# Patient Record
Sex: Female | Born: 1970 | Race: White | Hispanic: No | Smoking: Former smoker
Health system: Southern US, Community
[De-identification: ages and names within clinical notes are randomized; demographics above are authoritative.]

## PROBLEM LIST (undated history)

## (undated) DIAGNOSIS — E785 Hyperlipidemia, unspecified: Secondary | ICD-10-CM

## (undated) DIAGNOSIS — F32A Depression, unspecified: Secondary | ICD-10-CM

## (undated) DIAGNOSIS — K219 Gastro-esophageal reflux disease without esophagitis: Secondary | ICD-10-CM

## (undated) DIAGNOSIS — F909 Attention-deficit hyperactivity disorder, unspecified type: Secondary | ICD-10-CM

## (undated) DIAGNOSIS — F1910 Other psychoactive substance abuse, uncomplicated: Secondary | ICD-10-CM

## (undated) DIAGNOSIS — F419 Anxiety disorder, unspecified: Secondary | ICD-10-CM

## (undated) HISTORY — DX: Other psychoactive substance abuse, uncomplicated: F19.10

## (undated) HISTORY — DX: Depression, unspecified: F32.A

## (undated) HISTORY — DX: Anxiety disorder, unspecified: F41.9

## (undated) HISTORY — DX: Attention-deficit hyperactivity disorder, unspecified type: F90.9

## (undated) HISTORY — DX: Gastro-esophageal reflux disease without esophagitis: K21.9

## (undated) HISTORY — DX: Hyperlipidemia, unspecified: E78.5

---

## 1996-07-31 HISTORY — PX: NOSE SURGERY: SHX723

## 2011-08-01 HISTORY — PX: TOTAL ABDOMINAL HYSTERECTOMY: SHX209

## 2012-11-06 DIAGNOSIS — F988 Other specified behavioral and emotional disorders with onset usually occurring in childhood and adolescence: Secondary | ICD-10-CM | POA: Insufficient documentation

## 2012-12-04 DIAGNOSIS — Z87891 Personal history of nicotine dependence: Secondary | ICD-10-CM | POA: Insufficient documentation

## 2017-01-10 ENCOUNTER — Encounter: Admit: 2017-01-10 | Discharge: 2017-01-10 | Payer: BC Managed Care – PPO

## 2017-11-07 ENCOUNTER — Encounter: Admit: 2017-11-07 | Discharge: 2017-11-07 | Payer: BC Managed Care – PPO

## 2018-01-29 ENCOUNTER — Ambulatory Visit: Admit: 2018-01-29 | Discharge: 2018-01-29 | Payer: BC Managed Care – PPO

## 2018-01-29 ENCOUNTER — Encounter: Admit: 2018-01-29 | Discharge: 2018-01-29 | Payer: BC Managed Care – PPO

## 2018-01-29 DIAGNOSIS — M67471 Ganglion, right ankle and foot: ICD-10-CM

## 2018-01-29 DIAGNOSIS — G4700 Insomnia, unspecified: ICD-10-CM

## 2018-01-29 DIAGNOSIS — M67441 Ganglion, right hand: ICD-10-CM

## 2018-01-29 DIAGNOSIS — R202 Paresthesia of skin: ICD-10-CM

## 2018-01-29 DIAGNOSIS — Z1239 Encounter for other screening for malignant neoplasm of breast: ICD-10-CM

## 2018-01-29 DIAGNOSIS — K219 Gastro-esophageal reflux disease without esophagitis: ICD-10-CM

## 2018-01-29 DIAGNOSIS — R200 Anesthesia of skin: ICD-10-CM

## 2018-01-29 DIAGNOSIS — L409 Psoriasis, unspecified: ICD-10-CM

## 2018-01-29 DIAGNOSIS — Z0000 Encounter for general adult medical examination without abnormal findings: Secondary | ICD-10-CM

## 2018-01-29 DIAGNOSIS — K9041 Non-celiac gluten sensitivity: ICD-10-CM

## 2018-01-29 DIAGNOSIS — M542 Cervicalgia: ICD-10-CM

## 2018-01-29 DIAGNOSIS — F419 Anxiety disorder, unspecified: Principal | ICD-10-CM

## 2018-01-29 DIAGNOSIS — F909 Attention-deficit hyperactivity disorder, unspecified type: Principal | ICD-10-CM

## 2018-01-29 DIAGNOSIS — Z9889 Other specified postprocedural states: ICD-10-CM

## 2018-01-29 MED ORDER — CLONIDINE HCL 0.1 MG PO TAB
.1 mg | ORAL_TABLET | Freq: Two times a day (BID) | ORAL | 3 refills | Status: CN
Start: 2018-01-29 — End: ?

## 2018-01-29 MED ORDER — LISDEXAMFETAMINE 70 MG PO CAP
70 mg | ORAL_CAPSULE | Freq: Every morning | ORAL | 0 refills | Status: CN
Start: 2018-01-29 — End: ?

## 2018-01-30 LAB — CBC AND DIFF
Lab: 0.1 10*3/uL (ref 0–0.20)
Lab: 0.2 10*3/uL (ref 0–0.45)
Lab: 0.6 10*3/uL (ref 0–0.80)
Lab: 1 % (ref >60)
Lab: 2.3 10*3/uL (ref 1.0–4.8)
Lab: 3 % (ref >60)
Lab: 32 % (ref 24–44)
Lab: 386 10*3/uL (ref 150–400)
Lab: 4.2 10*3/uL (ref 1.8–7.0)
Lab: 4.4 M/UL (ref >60)
Lab: 56 % (ref 41–77)
Lab: 7.4 K/UL — ABNORMAL HIGH (ref 4.5–11.0)
Lab: 8 % (ref 4–12)
Lab: 8.7 FL (ref 7–11)

## 2018-01-30 LAB — COMPREHENSIVE METABOLIC PANEL
Lab: 137 MMOL/L (ref >60)
Lab: 4 MMOL/L (ref 3.5–5.1)

## 2018-01-30 LAB — LIPID PROFILE
Lab: 121 mg/dL — ABNORMAL HIGH (ref <100)
Lab: 148 mg/dL (ref 11–15)
Lab: 17 mg/dL (ref 32.0–36.0)
Lab: 217 mg/dL — ABNORMAL HIGH (ref <200)
Lab: 86 mg/dL (ref <150)

## 2018-01-30 LAB — TSH WITH FREE T4 REFLEX: Lab: 1.5 uU/mL (ref >40)

## 2018-01-30 LAB — FOLLICLE STIMULATING HORMONE: Lab: 7.7 mU/mL (ref 3–12)

## 2018-03-07 ENCOUNTER — Encounter: Admit: 2018-03-07 | Discharge: 2018-03-07 | Payer: BC Managed Care – PPO

## 2018-03-07 DIAGNOSIS — R2231 Localized swelling, mass and lump, right upper limb: Principal | ICD-10-CM

## 2018-03-13 ENCOUNTER — Encounter: Admit: 2018-03-13 | Discharge: 2018-03-13 | Payer: BC Managed Care – PPO

## 2018-03-13 ENCOUNTER — Ambulatory Visit: Admit: 2018-03-13 | Discharge: 2018-03-13 | Payer: BC Managed Care – PPO

## 2018-03-13 DIAGNOSIS — R2233 Localized swelling, mass and lump, upper limb, bilateral: Principal | ICD-10-CM

## 2018-03-13 DIAGNOSIS — R2241 Localized swelling, mass and lump, right lower limb: ICD-10-CM

## 2018-03-13 DIAGNOSIS — G4700 Insomnia, unspecified: ICD-10-CM

## 2018-03-13 DIAGNOSIS — K219 Gastro-esophageal reflux disease without esophagitis: ICD-10-CM

## 2018-03-13 DIAGNOSIS — R2231 Localized swelling, mass and lump, right upper limb: ICD-10-CM

## 2018-03-13 DIAGNOSIS — F909 Attention-deficit hyperactivity disorder, unspecified type: Principal | ICD-10-CM

## 2018-03-13 DIAGNOSIS — I82409 Acute embolism and thrombosis of unspecified deep veins of unspecified lower extremity: ICD-10-CM

## 2018-03-13 DIAGNOSIS — Z9889 Other specified postprocedural states: ICD-10-CM

## 2018-03-21 ENCOUNTER — Ambulatory Visit: Admit: 2018-03-21 | Discharge: 2018-03-21 | Payer: BC Managed Care – PPO

## 2018-03-21 DIAGNOSIS — R2241 Localized swelling, mass and lump, right lower limb: Principal | ICD-10-CM

## 2018-03-21 MED ORDER — GADOBENATE DIMEGLUMINE 529 MG/ML (0.1MMOL/0.2ML) IV SOLN
15 mL | Freq: Once | INTRAVENOUS | 0 refills | Status: CP
Start: 2018-03-21 — End: ?
  Administered 2018-03-21: 13:00:00 15 mL via INTRAVENOUS

## 2018-03-27 ENCOUNTER — Ambulatory Visit: Admit: 2018-03-27 | Discharge: 2018-03-28 | Payer: BC Managed Care – PPO

## 2018-03-27 ENCOUNTER — Encounter: Admit: 2018-03-27 | Discharge: 2018-03-27 | Payer: BC Managed Care – PPO

## 2018-03-27 DIAGNOSIS — G4700 Insomnia, unspecified: ICD-10-CM

## 2018-03-27 DIAGNOSIS — Z9889 Other specified postprocedural states: ICD-10-CM

## 2018-03-27 DIAGNOSIS — F909 Attention-deficit hyperactivity disorder, unspecified type: Principal | ICD-10-CM

## 2018-03-27 DIAGNOSIS — I82409 Acute embolism and thrombosis of unspecified deep veins of unspecified lower extremity: ICD-10-CM

## 2018-03-27 DIAGNOSIS — K219 Gastro-esophageal reflux disease without esophagitis: ICD-10-CM

## 2018-03-27 DIAGNOSIS — R2233 Localized swelling, mass and lump, upper limb, bilateral: Principal | ICD-10-CM

## 2018-03-27 DIAGNOSIS — R2241 Localized swelling, mass and lump, right lower limb: ICD-10-CM

## 2018-03-29 ENCOUNTER — Encounter: Admit: 2018-03-29 | Discharge: 2018-03-29 | Payer: BC Managed Care – PPO

## 2018-03-29 DIAGNOSIS — R2241 Localized swelling, mass and lump, right lower limb: ICD-10-CM

## 2018-03-29 DIAGNOSIS — R2233 Localized swelling, mass and lump, upper limb, bilateral: Principal | ICD-10-CM

## 2018-03-29 MED ORDER — CEFAZOLIN 1 GRAM IJ SOLR
2 g | Freq: Once | INTRAVENOUS | 0 refills | Status: CN
Start: 2018-03-29 — End: ?

## 2018-05-15 ENCOUNTER — Encounter: Admit: 2018-05-15 | Discharge: 2018-05-15 | Payer: BC Managed Care – PPO

## 2018-06-19 ENCOUNTER — Encounter: Admit: 2018-06-19 | Discharge: 2018-06-19 | Payer: BC Managed Care – PPO

## 2018-06-19 DIAGNOSIS — G4700 Insomnia, unspecified: ICD-10-CM

## 2018-06-19 DIAGNOSIS — I10 Essential (primary) hypertension: ICD-10-CM

## 2018-06-19 DIAGNOSIS — Z9889 Other specified postprocedural states: ICD-10-CM

## 2018-06-19 DIAGNOSIS — F909 Attention-deficit hyperactivity disorder, unspecified type: Principal | ICD-10-CM

## 2018-06-19 DIAGNOSIS — I82409 Acute embolism and thrombosis of unspecified deep veins of unspecified lower extremity: ICD-10-CM

## 2018-06-19 DIAGNOSIS — K219 Gastro-esophageal reflux disease without esophagitis: ICD-10-CM

## 2018-06-21 ENCOUNTER — Encounter: Admit: 2018-06-21 | Discharge: 2018-06-21 | Payer: BC Managed Care – PPO

## 2018-06-21 ENCOUNTER — Ambulatory Visit: Admit: 2018-06-21 | Discharge: 2018-06-21 | Payer: BC Managed Care – PPO

## 2018-06-21 DIAGNOSIS — Z9889 Other specified postprocedural states: ICD-10-CM

## 2018-06-21 DIAGNOSIS — F909 Attention-deficit hyperactivity disorder, unspecified type: Principal | ICD-10-CM

## 2018-06-21 DIAGNOSIS — R2233 Localized swelling, mass and lump, upper limb, bilateral: Principal | ICD-10-CM

## 2018-06-21 DIAGNOSIS — G4700 Insomnia, unspecified: ICD-10-CM

## 2018-06-21 DIAGNOSIS — K219 Gastro-esophageal reflux disease without esophagitis: ICD-10-CM

## 2018-06-21 DIAGNOSIS — I82409 Acute embolism and thrombosis of unspecified deep veins of unspecified lower extremity: ICD-10-CM

## 2018-06-21 DIAGNOSIS — R2241 Localized swelling, mass and lump, right lower limb: ICD-10-CM

## 2018-06-21 DIAGNOSIS — I10 Essential (primary) hypertension: ICD-10-CM

## 2018-06-21 MED ORDER — FENTANYL CITRATE (PF) 50 MCG/ML IJ SOLN
50 ug | INTRAVENOUS | 0 refills | Status: CN | PRN
Start: 2018-06-21 — End: ?

## 2018-06-21 MED ORDER — CEFAZOLIN 1 GRAM IJ SOLR
2 g | Freq: Once | INTRAVENOUS | 0 refills | Status: CP
Start: 2018-06-21 — End: ?

## 2018-06-21 MED ORDER — LIDOCAINE 10ML BUPIVACAINE 10ML SYRINGE
0 refills | Status: DC
Start: 2018-06-21 — End: 2018-06-26

## 2018-06-21 MED ORDER — ACETAMINOPHEN 500 MG PO TAB
1000 mg | Freq: Once | ORAL | 0 refills | Status: CP
Start: 2018-06-21 — End: ?

## 2018-06-21 MED ORDER — TRAMADOL 50 MG PO TAB
50 mg | ORAL | 0 refills | Status: DC | PRN
Start: 2018-06-21 — End: 2018-06-26

## 2018-06-21 MED ORDER — GABAPENTIN 300 MG PO CAP
300 mg | Freq: Once | ORAL | 0 refills | Status: CP
Start: 2018-06-21 — End: ?

## 2018-06-21 MED ORDER — SODIUM CHLORIDE 0.9 % IR SOLN
0 refills | Status: DC
Start: 2018-06-21 — End: 2018-06-26

## 2018-06-21 MED ORDER — FENTANYL CITRATE (PF) 50 MCG/ML IJ SOLN
0 refills | Status: DC
Start: 2018-06-21 — End: 2018-06-21

## 2018-06-21 MED ORDER — LIDOCAINE (PF) 200 MG/10 ML (2 %) IJ SYRG
0 refills | Status: DC
Start: 2018-06-21 — End: 2018-06-21

## 2018-06-21 MED ORDER — DOCUSATE SODIUM 100 MG PO CAP
100 mg | ORAL_CAPSULE | Freq: Two times a day (BID) | ORAL | 0 refills | Status: AC | PRN
Start: 2018-06-21 — End: 2019-03-25

## 2018-06-21 MED ORDER — DEXMEDETOMIDINE IN 0.9 % NACL 80 MCG/20 ML (4 MCG/ML) IV SOLN
0 refills | Status: DC
Start: 2018-06-21 — End: 2018-06-21

## 2018-06-21 MED ORDER — SCOPOLAMINE BASE 1 MG OVER 3 DAYS TD PT3D
1 | TRANSDERMAL | 0 refills | Status: DC
Start: 2018-06-21 — End: 2018-06-26

## 2018-06-21 MED ORDER — FENTANYL CITRATE (PF) 50 MCG/ML IJ SOLN
25 ug | INTRAVENOUS | 0 refills | Status: CN | PRN
Start: 2018-06-21 — End: ?

## 2018-06-21 MED ORDER — TRAMADOL 50 MG PO TAB
50-100 mg | ORAL_TABLET | ORAL | 0 refills | Status: AC | PRN
Start: 2018-06-21 — End: 2019-03-25
  Filled 2018-06-21 (×2): qty 30, 4d supply, fill #1

## 2018-06-21 MED ORDER — PROMETHAZINE 25 MG/ML IJ SOLN
6.25 mg | INTRAVENOUS | 0 refills | Status: CN | PRN
Start: 2018-06-21 — End: ?

## 2018-06-21 MED ORDER — DEXAMETHASONE SODIUM PHOSPHATE 4 MG/ML IJ SOLN
INTRAVENOUS | 0 refills | Status: DC
Start: 2018-06-21 — End: 2018-06-21

## 2018-06-21 MED ORDER — ONDANSETRON HCL (PF) 4 MG/2 ML IJ SOLN
INTRAVENOUS | 0 refills | Status: DC
Start: 2018-06-21 — End: 2018-06-21

## 2018-06-21 MED ORDER — LIDOCAINE (PF) 10 MG/ML (1 %) IJ SOLN
.1-2 mL | INTRAMUSCULAR | 0 refills | Status: DC | PRN
Start: 2018-06-21 — End: 2018-06-26

## 2018-06-21 MED ORDER — CELECOXIB 200 MG PO CAP
200 mg | Freq: Once | ORAL | 0 refills | Status: CP
Start: 2018-06-21 — End: ?

## 2018-06-21 MED ORDER — PROPOFOL INJ 10 MG/ML IV VIAL
0 refills | Status: DC
Start: 2018-06-21 — End: 2018-06-21

## 2018-06-21 MED ORDER — HALOPERIDOL LACTATE 5 MG/ML IJ SOLN
1 mg | Freq: Once | INTRAVENOUS | 0 refills | Status: CN | PRN
Start: 2018-06-21 — End: ?

## 2018-06-21 MED ORDER — ONDANSETRON HCL (PF) 4 MG/2 ML IJ SOLN
4 mg | Freq: Once | INTRAVENOUS | 0 refills | Status: CN | PRN
Start: 2018-06-21 — End: ?

## 2018-06-21 MED ORDER — OXYCODONE 5 MG PO TAB
5-10 mg | Freq: Once | ORAL | 0 refills | Status: CN | PRN
Start: 2018-06-21 — End: ?

## 2018-06-21 MED ORDER — MIDAZOLAM 1 MG/ML IJ SOLN
INTRAVENOUS | 0 refills | Status: DC
Start: 2018-06-21 — End: 2018-06-21

## 2018-06-21 MED ORDER — LACTATED RINGERS IV SOLP
1000 mL | INTRAVENOUS | 0 refills | Status: DC
Start: 2018-06-21 — End: 2018-06-26

## 2018-06-21 MED ORDER — HYDROMORPHONE (PF) 2 MG/ML IJ SYRG
.5-1 mg | INTRAVENOUS | 0 refills | Status: CN | PRN
Start: 2018-06-21 — End: ?

## 2018-06-24 ENCOUNTER — Encounter: Admit: 2018-06-24 | Discharge: 2018-06-24 | Payer: BC Managed Care – PPO

## 2018-06-24 DIAGNOSIS — F909 Attention-deficit hyperactivity disorder, unspecified type: Principal | ICD-10-CM

## 2018-06-24 DIAGNOSIS — I10 Essential (primary) hypertension: ICD-10-CM

## 2018-06-24 DIAGNOSIS — G4700 Insomnia, unspecified: ICD-10-CM

## 2018-06-24 DIAGNOSIS — K219 Gastro-esophageal reflux disease without esophagitis: ICD-10-CM

## 2018-06-24 DIAGNOSIS — I82409 Acute embolism and thrombosis of unspecified deep veins of unspecified lower extremity: ICD-10-CM

## 2018-06-24 DIAGNOSIS — Z9889 Other specified postprocedural states: ICD-10-CM

## 2018-07-04 ENCOUNTER — Ambulatory Visit: Admit: 2018-07-04 | Discharge: 2018-07-05 | Payer: BC Managed Care – PPO

## 2018-07-04 DIAGNOSIS — R2241 Localized swelling, mass and lump, right lower limb: ICD-10-CM

## 2018-07-04 DIAGNOSIS — R2233 Localized swelling, mass and lump, upper limb, bilateral: Principal | ICD-10-CM

## 2018-12-17 ENCOUNTER — Encounter: Admit: 2018-12-17 | Discharge: 2018-12-17 | Payer: BC Managed Care – PPO

## 2019-01-15 ENCOUNTER — Encounter: Admit: 2019-01-15 | Discharge: 2019-01-15

## 2019-01-15 DIAGNOSIS — Z1159 Encounter for screening for other viral diseases: Principal | ICD-10-CM

## 2019-01-15 NOTE — Progress Notes
..  Patient arrived to [facility] for COVID-19 testing 01/15/19 1706. Patient identity confirmed via photo I.D. Nasopharyngeal procedure explained to the patient.   Nasopharyngeal swab completed left  Patient education provided given and instructed patient self isolate until contacted w/ results and further instructions.   Swab collected by [facility] RN.    Date symptoms began/reason for testing: sore throat, vomiting, diarrhea, HA, abd pain, loss of taste and smell

## 2019-01-15 NOTE — Telephone Encounter
Returned call to pt at this time. She is calling to report that she has had onset of vomiting, diarrhea, sore throat, has an autoimmune issue and has been exposed to [place] virus at work; she is requesting testing. Pt given number to Covid Hotline at [phone]. Pt vu and appreciation.

## 2019-01-15 NOTE — Telephone Encounter
Patient called with concerns related to [place].    Patient experiencing any life-threatening symptoms? No  Extreme difficulty breathing, blue lips/face, severe/constant pain or pressure in the chest, altered mental status, slurred speech, seizure, coughing up blood, too weak to stand, etc.  - If yes, 911 or ED recommended.     Known or suspected contact with COVID positive case? No    Patient in long term care, skilled nursing, etc.? No    COVID symptoms? Yes, Headache, Loss of taste/smell, Sore throat, Vomiting, Diarrhea and Abdominal pain    Date symptoms began: 01/15/19    Please select any risk factors: Underlying medical condition and Other Per PCP    Assessment: COVID suspected, high risk/special population, testing recommended.    Plan: Referred patient to [place] hotline.

## 2019-01-16 ENCOUNTER — Encounter: Admit: 2019-01-16 | Discharge: 2019-01-16

## 2019-01-16 ENCOUNTER — Encounter: Admit: 2019-01-15 | Discharge: 2019-01-16

## 2019-01-16 LAB — COVID-19 (SARS-COV-2) PCR

## 2019-01-30 ENCOUNTER — Encounter: Admit: 2019-01-30 | Discharge: 2019-01-30

## 2019-01-30 DIAGNOSIS — F909 Attention-deficit hyperactivity disorder, unspecified type: Secondary | ICD-10-CM

## 2019-01-30 DIAGNOSIS — Z9889 Other specified postprocedural states: Secondary | ICD-10-CM

## 2019-01-30 DIAGNOSIS — K219 Gastro-esophageal reflux disease without esophagitis: Secondary | ICD-10-CM

## 2019-01-30 DIAGNOSIS — I82409 Acute embolism and thrombosis of unspecified deep veins of unspecified lower extremity: Secondary | ICD-10-CM

## 2019-01-30 DIAGNOSIS — G4700 Insomnia, unspecified: Secondary | ICD-10-CM

## 2019-01-30 DIAGNOSIS — I10 Essential (primary) hypertension: Secondary | ICD-10-CM

## 2019-01-30 MED ORDER — NITROFURANTOIN MONOHYD/M-CRYST 100 MG PO CAP
100 mg | ORAL_CAPSULE | Freq: Two times a day (BID) | ORAL | 0 refills | 7.00000 days | Status: DC
Start: 2019-01-30 — End: 2019-03-25

## 2019-01-31 ENCOUNTER — Ambulatory Visit: Admit: 2019-01-31 | Discharge: 2019-01-31

## 2019-01-31 DIAGNOSIS — N3000 Acute cystitis without hematuria: Secondary | ICD-10-CM

## 2019-01-31 NOTE — Progress Notes
Date of Service: 01/30/2019    Victoria Gill is a 48 y.o. female.  DOB: 08-11-1970  MRN: [id number]     Subjective:             History of Present Illness  48 year old female presents with a 12-hour history of dysuria frequency chills but no fever.  Denies having flank pain.  She has a history of prior urinary tract infections the last one being in December.       Review of Systems   Constitutional: Positive for chills. Negative for fatigue and fever.   Respiratory: Negative for cough.    Cardiovascular: Negative for chest pain.   Gastrointestinal: Negative for constipation, diarrhea, nausea and vomiting.   Genitourinary: Positive for dysuria, frequency and urgency.   Neurological: Negative for weakness and headaches.         Objective:         ??? cloNIDine HCl (CATAPRESS) 0.1 mg tablet Take 0.1 mg by mouth twice daily.   ??? docosahexanoic acid/epa (FISH OIL PO) Take  by mouth.   ??? docusate (COLACE) 100 mg capsule Take one capsule by mouth twice daily as needed.   ??? guselkumab 100 mg/mL syrg Inject  under the skin every 8 weeks.   ??? lisdexamfetamine(+) (VYVANSE) 70 mg capsule Take 70 mg by mouth every morning   ??? MELATONIN PO Take  by mouth.   ??? nicotine polacrilex (NICORETTE) 4 mg gum Place 4 mg inside cheek (side of mouth) every 1 hour as needed.   ??? nitrofurantoin monohyd/m-cryst (MACROBID) 100 mg capsule Take one capsule by mouth every 12 hours. Take with food.   ??? omeprazole DR(+) (PRILOSEC) 20 mg capsule Take 20 mg by mouth daily before breakfast.   ??? traMADol (ULTRAM) 50 mg tablet Take one tablet to two tablets by mouth every 6 hours as needed for Pain.     Vitals:    01/30/19 2014   BP: 114/64   Pulse: 76   Resp: 16   Temp: 36.6 ???C (97.9 ???F)   TempSrc: Oral   Weight: 70.4 kg (155 lb 3.2 oz)   Height: 162.6 cm (64)   PainSc: Ten     Body mass index is 26.64 kg/m???.     Physical Exam  Vitals signs and nursing note reviewed.   Constitutional:       Appearance: She is well-developed.   Cardiovascular: Rate and Rhythm: Normal rate and regular rhythm.   Neurological:      Mental Status: She is alert and oriented to person, place, and time.   Psychiatric:         Behavior: Behavior normal.         Thought Content: Thought content normal.         Judgment: Judgment normal.       Dipstick UA is 1+ positive for leukocyte esterase negative for nitrite 1+ for blood pH 5.5       Assessment and Plan:  1. Acute cystitis without hematuria  CULTURE-URINE W/SENSITIVITY     Plan:  Nitrofurantoin x7 days  Urine culture and sensitivity

## 2019-02-01 ENCOUNTER — Encounter: Admit: 2019-01-31 | Discharge: 2019-02-01

## 2019-02-01 DIAGNOSIS — N3000 Acute cystitis without hematuria: Principal | ICD-10-CM

## 2019-02-02 LAB — CULTURE-URINE W/SENSITIVITY
Lab: <10
Lab: <10 — AB

## 2019-02-05 ENCOUNTER — Encounter: Admit: 2019-02-05 | Discharge: 2019-02-05

## 2019-02-05 MED ORDER — CEPHALEXIN 500 MG PO CAP
1000 mg | ORAL_CAPSULE | Freq: Two times a day (BID) | ORAL | 0 refills | Status: AC
Start: 2019-02-05 — End: ?

## 2019-02-05 NOTE — Telephone Encounter
Patient called stating that she was seen in urgent care (7/2)  for uti. She was given an antibiotic, but she is still having symptoms. She states when she does have a UTI, that only a specific antibiotic usually clears her UTI . Though patients message was cutting out and was unable to make out which medication patient named. Attempted to reach patient, left message for patient to return call to our office.

## 2019-02-05 NOTE — Telephone Encounter
Called and spoke with patient regarding results and recommendations . Patient will call our office if this does not improve

## 2019-02-05 NOTE — Telephone Encounter
Sent in keflex  Have her fu if no imp or if worse

## 2019-02-05 NOTE — Telephone Encounter
Keflex not listed as an appropriate medication, that's why I sent in [place].

## 2019-02-05 NOTE — Telephone Encounter
You didn't send in macrobid, she was seen at urgent care and it was sent by dr. Zenia Resides in urgent care. Her symptoms are returning, please advise

## 2019-02-05 NOTE — Telephone Encounter
Patient called back, she states the medication that usually clears her UTI is keflex. Her symptoms had started to improve but then returned the last couple days. Urine culture and sensitivity in chart , patient requesting keflex. PLease see note below as well

## 2019-02-06 ENCOUNTER — Encounter: Admit: 2019-02-06 | Discharge: 2019-02-06

## 2019-02-06 DIAGNOSIS — I82409 Acute embolism and thrombosis of unspecified deep veins of unspecified lower extremity: Secondary | ICD-10-CM

## 2019-02-06 DIAGNOSIS — I10 Essential (primary) hypertension: Secondary | ICD-10-CM

## 2019-02-06 DIAGNOSIS — F909 Attention-deficit hyperactivity disorder, unspecified type: Secondary | ICD-10-CM

## 2019-02-06 DIAGNOSIS — Z9889 Other specified postprocedural states: Secondary | ICD-10-CM

## 2019-02-06 DIAGNOSIS — K219 Gastro-esophageal reflux disease without esophagitis: Secondary | ICD-10-CM

## 2019-02-06 DIAGNOSIS — G4700 Insomnia, unspecified: Secondary | ICD-10-CM

## 2019-02-06 DIAGNOSIS — B373 Candidiasis of vulva and vagina: Secondary | ICD-10-CM

## 2019-02-06 MED ORDER — CEFTRIAXONE (IM) 1GM/2.86ML (LIDOCAINE)
1000 mg | Freq: Once | INTRAMUSCULAR | 0 refills | Status: CP
Start: 2019-02-06 — End: ?
  Administered 2019-02-06: 20:00:00 1000 mg via INTRAMUSCULAR

## 2019-02-06 MED ORDER — FLUCONAZOLE 150 MG PO TAB
150 mg | ORAL_TABLET | Freq: Once | ORAL | 0 refills | 3.00000 days | Status: AC
Start: 2019-02-06 — End: ?

## 2019-02-06 NOTE — Progress Notes
SUBJECTIVE: Victoria Gill is a 48 y.o. female who complains of urinary frequency, urgency and dysuria x 6 days, without flank pain, fever, chills, or abnormal vaginal discharge or bleeding. She was seen in the UC last Friday, started on macrobid but after a few days that didn't work. She called yesterday requesting keflex, we called it in and she presents today worried that it is not working, she has taken 2 doses. Her ua and culture are reviewed and both macrobid and ceftriaxone are appropriate antibiotics. This is discussed with her. She is concerned that she rode the bike at the gym and had back pain that night, worried she could have kidney stones but she is not having back pain today. Some pain with urination, no gross hematuria,  No vaginal dc and no risk of std.    OBJECTIVE: Appears well, in no apparent distress.  Vital signs are normal. The abdomen is soft without tenderness, guarding, mass, rebound or organomegaly. No CVA tenderness or inguinal adenopathy noted. Urine dipstick shows not done.  Micro exam: not done.     Benjiman Core. [name] was seen today for uti.    Diagnoses and all orders for this visit:    Acute cystitis with hematuria  -     cefTRIAXone IM (in 1% lidocaine) (ROCEPHIN) injection 1,000 mg  Cont with keflex  Plenty of fluids  Yeast infection of the vagina  -     fluconazole (DIFLUCAN) 150 mg tablet; Take one tablet by mouth once for 1 dose.

## 2019-02-07 ENCOUNTER — Ambulatory Visit: Admit: 2019-02-06 | Discharge: 2019-02-07

## 2019-02-07 DIAGNOSIS — N3001 Acute cystitis with hematuria: Secondary | ICD-10-CM

## 2019-03-24 ENCOUNTER — Encounter: Admit: 2019-03-24 | Discharge: 2019-03-24

## 2019-03-25 ENCOUNTER — Encounter: Admit: 2019-03-25 | Discharge: 2019-03-25

## 2019-03-25 ENCOUNTER — Ambulatory Visit: Admit: 2019-03-25 | Discharge: 2019-03-25

## 2019-03-25 ENCOUNTER — Ambulatory Visit: Admit: 2019-03-25 | Discharge: 2019-03-26

## 2019-03-25 DIAGNOSIS — I10 Essential (primary) hypertension: Secondary | ICD-10-CM

## 2019-03-25 DIAGNOSIS — Z1239 Encounter for other screening for malignant neoplasm of breast: Principal | ICD-10-CM

## 2019-03-25 DIAGNOSIS — G4700 Insomnia, unspecified: Secondary | ICD-10-CM

## 2019-03-25 DIAGNOSIS — L409 Psoriasis, unspecified: Secondary | ICD-10-CM

## 2019-03-25 DIAGNOSIS — F909 Attention-deficit hyperactivity disorder, unspecified type: Secondary | ICD-10-CM

## 2019-03-25 DIAGNOSIS — R739 Hyperglycemia, unspecified: Secondary | ICD-10-CM

## 2019-03-25 DIAGNOSIS — Z1159 Encounter for screening for other viral diseases: Secondary | ICD-10-CM

## 2019-03-25 DIAGNOSIS — Z0000 Encounter for general adult medical examination without abnormal findings: Principal | ICD-10-CM

## 2019-03-25 DIAGNOSIS — F411 Generalized anxiety disorder: Secondary | ICD-10-CM

## 2019-03-25 DIAGNOSIS — Z114 Encounter for screening for human immunodeficiency virus [HIV]: Secondary | ICD-10-CM

## 2019-03-25 DIAGNOSIS — Z87891 Personal history of nicotine dependence: Secondary | ICD-10-CM

## 2019-03-25 DIAGNOSIS — K219 Gastro-esophageal reflux disease without esophagitis: Secondary | ICD-10-CM

## 2019-03-25 DIAGNOSIS — F5101 Primary insomnia: Secondary | ICD-10-CM

## 2019-03-25 DIAGNOSIS — Z9889 Other specified postprocedural states: Secondary | ICD-10-CM

## 2019-03-25 DIAGNOSIS — I82409 Acute embolism and thrombosis of unspecified deep veins of unspecified lower extremity: Secondary | ICD-10-CM

## 2019-03-25 DIAGNOSIS — F988 Other specified behavioral and emotional disorders with onset usually occurring in childhood and adolescence: Secondary | ICD-10-CM

## 2019-03-25 LAB — HM HEPATITIS C SCREENING LAB: HM Hepatitis Screen: NEGATIVE

## 2019-03-25 LAB — HIV ANTIBODY (ROUTINE TESTING W REFLEX): HIV 1&2 Ab, 4th Generation: NONREACTIVE

## 2019-03-25 LAB — LIPID PROFILE
Lab: 261 mg/dL — ABNORMAL HIGH (ref <200)
Lab: 97 mg/dL (ref <150)

## 2019-03-25 LAB — HEMOGLOBIN A1C: Lab: 5.3 % (ref 4.0–6.0)

## 2019-03-25 LAB — COMPREHENSIVE METABOLIC PANEL
Lab: 0.4 mg/dL (ref 0.3–1.2)
Lab: 0.7 mg/dL (ref 0.4–1.00)
Lab: 108 mg/dL — ABNORMAL HIGH (ref 70–100)
Lab: 138 MMOL/L (ref 137–147)
Lab: 15 U/L (ref 7–40)
Lab: 17 U/L (ref 7–56)
Lab: 24 MMOL/L (ref 21–30)
Lab: 4.2 g/dL (ref 3.5–5.0)
Lab: 4.4 MMOL/L (ref 3.5–5.1)
Lab: 43 U/L (ref 25–110)
Lab: 6 mg/dL — ABNORMAL LOW (ref 7–25)
Lab: 6.8 g/dL (ref 6.0–8.0)
Lab: 7 10*3/uL (ref 3–12)
Lab: 9 mg/dL (ref 8.5–10.6)
Lab: >60 mL/min (ref >60)
Lab: >60 mL/min (ref >60)

## 2019-03-25 LAB — CBC AND DIFF
Lab: 0 10*3/uL (ref 0–0.20)
Lab: 4.6 M/UL — ABNORMAL HIGH (ref <100)
Lab: 5.7 10*3/uL (ref >40)

## 2019-03-25 LAB — HIV 1& 2 AG-AB SCRN W REFLEX HIV 1 PCR QUANT

## 2019-03-25 LAB — TSH WITH FREE T4 REFLEX: Lab: 1.3 uU/mL (ref 0.35–5.00)

## 2019-03-25 LAB — HEPATITIS C ANTIBODY W REFLEX HCV PCR QUANT

## 2019-03-25 MED ORDER — CLONIDINE HCL 0.1 MG PO TAB
.1 mg | ORAL_TABLET | Freq: Two times a day (BID) | ORAL | 1 refills | Status: CN
Start: 2019-03-25 — End: ?

## 2019-03-25 NOTE — Patient Instructions
Victoria Gill was seen today for physical.    Diagnoses and all orders for this visit:    Well woman exam (no gynecological exam)  -     TSH WITH FREE T4 REFLEX; Future; Expected date: 03/25/2019  -     COMPREHENSIVE METABOLIC PANEL; Future; Expected date: 03/25/2019  -     CBC AND DIFF; Future; Expected date: 03/25/2019  -     LIPID PROFILE; Future; Expected date: 03/25/2019    Breast cancer screening  -     MAMMO SCREEN BILAT/TOMO/CAD; Future; Expected date: 03/25/2019    Encounter for hepatitis C screening test for low risk patient  -     HEPATITIS C ANTIBODY W REFLEX HCV PCR QUANT; Future; Expected date: 03/25/2019    Gastroesophageal reflux disease without esophagitis    Primary insomnia    Tobacco  Smoker  Resources for Patients to Quit Smoking    If you live outside of [place] or [place] Quit line: 1-800-QUIT-NOW 539 292 9681) This line will route you to your state sponsored quit line based on your calling area.  The website for the national quit line is: www.smokefree.gov    For [place] residents:     [place] [employer] KanQuit program  (551)397-4051 or (574)077-5576)  ConnectRV.is.html  Free program 24hrs per day 7 days per week. English, [place], & 150 other languages.  [place] Quit line: 1-866-KAN-STOP (1-324-401-0272)  http://www.kstask.org/quitline.html  Free program available 24 hrs per day 7 days per week. Information is available via phone or online. Phone assessment by trained counselor to help identify triggers and set goals, smoker can call as many times as desired.    For [place] residents  [place] quit line website:  ThisMLS.nl    link directly to the [place] quit line patient brochure:  TVFolio.ch.pdf      Attention deficit disorder, unspecified hyperactivity presence    Psoriasis Screening for HIV (human immunodeficiency virus)  -     HIV 1& 2 AG-AB SCRN W REFLEX HIV 1 PCR QUANT; Future; Expected date: 03/25/2019    GAD (generalized anxiety disorder)
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
## 2019-03-26 ENCOUNTER — Encounter: Admit: 2019-03-26 | Discharge: 2019-03-26

## 2019-03-26 DIAGNOSIS — Z1239 Encounter for other screening for malignant neoplasm of breast: Secondary | ICD-10-CM

## 2019-03-26 NOTE — Progress Notes
Patient notified.  Order entered

## 2019-03-26 NOTE — Progress Notes
Notified patient of result of lab and provider recommendations

## 2019-05-20 ENCOUNTER — Encounter: Admit: 2019-05-20 | Discharge: 2019-05-20 | Payer: BC Managed Care – PPO

## 2019-05-20 DIAGNOSIS — R922 Inconclusive mammogram: Secondary | ICD-10-CM

## 2019-05-20 DIAGNOSIS — Z1239 Encounter for other screening for malignant neoplasm of breast: Secondary | ICD-10-CM

## 2019-06-10 ENCOUNTER — Encounter: Admit: 2019-06-10 | Discharge: 2019-06-10 | Payer: BC Managed Care – PPO

## 2019-06-10 ENCOUNTER — Ambulatory Visit: Admit: 2019-06-10 | Discharge: 2019-06-10 | Payer: BC Managed Care – PPO

## 2019-06-10 DIAGNOSIS — R300 Dysuria: Secondary | ICD-10-CM

## 2019-06-10 DIAGNOSIS — Z9889 Other specified postprocedural states: Secondary | ICD-10-CM

## 2019-06-10 DIAGNOSIS — N3001 Acute cystitis with hematuria: Secondary | ICD-10-CM

## 2019-06-10 DIAGNOSIS — F411 Generalized anxiety disorder: Secondary | ICD-10-CM

## 2019-06-10 DIAGNOSIS — F909 Attention-deficit hyperactivity disorder, unspecified type: Secondary | ICD-10-CM

## 2019-06-10 DIAGNOSIS — K219 Gastro-esophageal reflux disease without esophagitis: Secondary | ICD-10-CM

## 2019-06-10 DIAGNOSIS — G4700 Insomnia, unspecified: Secondary | ICD-10-CM

## 2019-06-10 DIAGNOSIS — I82409 Acute embolism and thrombosis of unspecified deep veins of unspecified lower extremity: Secondary | ICD-10-CM

## 2019-06-10 MED ORDER — SULFAMETHOXAZOLE-TRIMETHOPRIM 800-160 MG PO TAB
1 | ORAL_TABLET | Freq: Two times a day (BID) | ORAL | 0 refills | Status: AC
Start: 2019-06-10 — End: ?

## 2019-06-10 NOTE — Progress Notes
Date of Service: 06/10/2019    Victoria Gill is a 48 y.o. female.  DOB: 09-09-1970  MRN: [id number]     Subjective:             History of Present Illness  Chief Complaint   Patient presents with   ? Urinary Frequency     x 3d   ? Urinary Pain     Has been trying Azo, no relief. Has had several UTI in the past couple of years.  Last treatment in July, that one was bad took 3 different antibiotics to get it to clear. This time is not as bad.    Increased BP and HR noted, patient states she rushed from work and it was a really bad morning.       Review of Systems   Constitutional: Negative for chills, diaphoresis and fever.   Gastrointestinal: Negative for abdominal pain, nausea and vomiting.   Genitourinary: Positive for dysuria, frequency and urgency. Negative for flank pain and hematuria.        Hyst         Objective:         ? cloNIDine HCl (CATAPRESS) 0.1 mg tablet Take 0.1 mg by mouth twice daily.   ? dextroamphetamine-amphetamine (ADDERALL) 15 mg tablet Take by mouth three times daily   ? guselkumab 100 mg/mL syrg Inject  under the skin every 8 weeks.   ? hydrOXYzine (ATARAX) 25 mg tablet Take 25 mg by mouth three times daily as needed for Anxiety.   ? MELATONIN PO Take  by mouth.   ? nicotine polacrilex (NICORETTE) 4 mg gum Place 4 mg inside cheek (side of mouth) every 1 hour as needed.   ? omeprazole DR(+) (PRILOSEC) 20 mg capsule Take 20 mg by mouth daily before breakfast.     Vitals:    06/10/19 1223   BP: (!) 142/82   Pulse: 100   Resp: 16   Temp: 36.9 ?C (98.4 ?F)   TempSrc: Oral   SpO2: 100%   Weight: 71 kg (156 lb 9.6 oz)   Height: 162.6 cm (64)   PainSc: Five     Body mass index is 26.88 kg/m?Marland Kitchen     Physical Exam  Vitals signs reviewed.   Constitutional:       General: She is not in acute distress.     Appearance: Normal appearance. She is not ill-appearing.   Cardiovascular:      Rate and Rhythm: Normal rate and regular rhythm.      Heart sounds: Normal heart sounds.   Pulmonary: Effort: Pulmonary effort is normal. No respiratory distress.      Breath sounds: Normal breath sounds.   Abdominal:      Tenderness: There is abdominal tenderness. There is no right CVA tenderness or left CVA tenderness.   Neurological:      Mental Status: She is alert.       Results for orders placed or performed in visit on 06/10/19 (from the past 336 hour(s))   POC URINE DIPSTICK AUTO READ   Result Value Ref Range    Urine Glucose POC Negative     Urine Bilirubin POC Negative     Urine Ketone POC Negative     Urine Specific Gravity POC 1.010     Urine Blood POC Trace     Urine PH POC 5.0     Urine Protein POC Negative     Urine Urobilinogen POC 0.2     Urine  Nitrite POC Positive     Urine Leukocytes POC Negative     Color,UA Yellow     Turbidity,UA Clear             Assessment and Plan:  Victoria Gill was seen today for urinary frequency and urinary pain.    Diagnoses and all orders for this visit:    Acute cystitis with hematuria    Dysuria  -     POC URINE DIPSTICK AUTO READ  -     CULTURE-URINE W/SENSITIVITY; Future; Expected date: 06/10/2019    Other orders  -     trimethoprim/sulfamethoxazole (BACTRIM DS) 160/800 mg tablet; Take one tablet by mouth twice daily for 5 days.    Urine culture is pending. Discussed with the patient that I would recommend a follow up with urogyn given her frequency of infections and history of hysterectomy.  Patient voices understanding    Patient Instructions   Recommend a follow up with urogynecologist    Bladder Infection,?Female (Adult)?    Urine normally doesn't have any germs (bacteria) in it. But bacteria can get into the urinary tract from the skin around the rectum. Or they can travel in the blood from other parts of the body. Once they are in your urinary tract, they can cause infection in these areas:  ? The urethra (urethritis)  ? The bladder (cystitis)  ? The kidneys (pyelonephritis)  The most common place for an infection is in the bladder. This is called a bladder infection. This is one of the most common infections in women. Most bladder infections are easily treated. They are not serious unless the infection spreads to the kidney.  The terms bladder infection, UTI, and cystitis are often used to describe the same thing. But they are not always the same. Cystitis is an inflammation of the bladder. The?most common cause of cystitis is an infection.  Symptoms  The infection causes inflammation in the urethra and bladder. This causes many of the symptoms. The most common symptoms of a bladder infection are:  ? Pain or burning when urinating  ? Having to urinate more often than normal  ? Urgent need to urinate  ? Only a small amount of urine comes out  ? Blood in urine  ? Belly (abdominal) discomfort. This is often in the lower belly above the pubic bone.  ? Cloudy urine  ? Strong- or bad-smelling urine  ? Unable to urinate (urinary retention)  ? Unable to hold urine in (urinary incontinence)  ? Fever  ? Loss of appetite  ? Confusion (in older adults)  Causes  Bladder infections are not contagious. You can't get one from someone else, from a toilet seat, or from sharing a bath.  The most common cause of bladder infections is bacteria from the bowels. The bacteria get onto the skin around the opening of the urethra. From there, they can get into the urine. Then they travel up to the bladder, causing inflammation and infection. This often happens because of:  ? Wiping incorrectly after urinating. Always wipe from front to back.  ? Bowel incontinence  ? Pregnancy  ? Procedures such as having a catheter put in  ? Older age  ? Not emptying your bladder. This can give bacteria a chance to grow in your urine.  ? Fluid loss (dehydration)  ? Constipation  ? Having sex  ? Using a diaphragm for birth control?  Treatment  Bladder infections are diagnosed by a urine test and  urine culture. They are treated with antibiotics. They often?clear up quickly without problems. Treatment helps prevent a more serious kidney infection.  Medicines  Medicines can help in the treatment of a bladder infection:  ? Take antibiotics until they are used up, even if you feel better. It's important to finish them to make sure the infection has cleared.  ? You can use acetaminophen or ibuprofen for pain, fever, or discomfort, unless another medicine was prescribed. If you have long-term (chronic) liver or kidney disease, talk with your healthcare?provider before using?these medicines. Also talk with your provider if you've ever had a stomach ulcer or GI (gastrointestinal) bleeding, or are taking blood-thinner medicines.  ? If you are given?phenazopydridine to reduce burning with urination, it will make your urine a bright orange color. This can stain clothing.  Care and prevention  These self-care steps can help prevent future infections:  ? Drink plenty of fluids. This helps to prevent dehydration and flush out your bladder. Do this?unless you must restrict fluids for other health reasons, or your healthcare provider told you not to.  ? Clean yourself correctly after going to the bathroom. Wipe from front to back after using the toilet. This helps prevent the spread of bacteria.  ? Urinate more often. Don't try to hold urine in for a long time.  ? Wear loose-fitting clothes and cotton underwear. Don't wear tight-fitting pants.  ? Improve your diet and prevent constipation. Eat more fresh fruits and vegetables, and?fiber. Eat less junk foods and fatty foods.  ? Don't have sex until your symptoms are gone.  ? Don't have caffeine, alcohol, and spicy foods. These can irritate your bladder.  ? Urinate right after you have sex to flush out your bladder.  ? If you use birth control pills and have frequent bladder infections, discuss it with your healthcare provider.  Follow-up care  Call your healthcare provider if all symptoms are not gone after 3 days of treatment. This is especially important if you have repeat infections.  If a culture was done, you will be told if your treatment needs to be changed. If directed, you can call?to find out the results.  If X-rays were done, you will be told if the results will affect your?treatment.  Call 911  Call 911 if any of the following occur:  ? Trouble breathing  ? Hard to wake up or?confusion  ? Fainting (loss of consciousness)  ? Fast heart rate  When to get medical advice  Call your healthcare provider right away if any of these occur:  ? Fever of 100.4?F (38.0?C) or higher, or as directed by your healthcare provider  ? Symptoms are not better?after 3 days of treatment  ? Back or belly pain that gets worse  ? Repeated vomiting, or unable to keep medicine down  ? Weakness or dizziness  ? Vaginal discharge  ? Pain, redness, or swelling in the outer vaginal area (labia)  [employer] last reviewed this educational content on 05/31/2018  ? 2000-2020 [employer] [place]. [address] [place], [place] [address]. All rights reserved. This information is not intended as a substitute for professional medical care. Always follow your healthcare professional's instructions.

## 2019-06-11 ENCOUNTER — Encounter: Admit: 2019-06-11 | Discharge: 2019-06-10 | Payer: BC Managed Care – PPO

## 2019-06-17 ENCOUNTER — Encounter: Admit: 2019-06-17 | Discharge: 2019-06-17 | Payer: BC Managed Care – PPO

## 2019-06-17 NOTE — Telephone Encounter
pt was seen 06/10/19 in [facility] for urinary issues she called in today requesting a refill on her antibiotics I informed her that we would not be able to call in a refill her urine culture showed no bacterial growth she would need to follow providers recommendation for follow up she stated she understood

## 2019-07-08 ENCOUNTER — Encounter: Admit: 2019-07-08 | Discharge: 2019-07-08 | Payer: BC Managed Care – PPO

## 2019-07-08 DIAGNOSIS — F909 Attention-deficit hyperactivity disorder, unspecified type: Secondary | ICD-10-CM

## 2019-07-08 DIAGNOSIS — F411 Generalized anxiety disorder: Secondary | ICD-10-CM

## 2019-07-08 DIAGNOSIS — I82409 Acute embolism and thrombosis of unspecified deep veins of unspecified lower extremity: Secondary | ICD-10-CM

## 2019-07-08 DIAGNOSIS — K219 Gastro-esophageal reflux disease without esophagitis: Secondary | ICD-10-CM

## 2019-07-08 DIAGNOSIS — R350 Frequency of micturition: Secondary | ICD-10-CM

## 2019-07-08 DIAGNOSIS — Z9889 Other specified postprocedural states: Secondary | ICD-10-CM

## 2019-07-08 DIAGNOSIS — G4700 Insomnia, unspecified: Secondary | ICD-10-CM

## 2019-10-30 DIAGNOSIS — L409 Psoriasis, unspecified: Secondary | ICD-10-CM | POA: Insufficient documentation

## 2020-04-15 ENCOUNTER — Encounter: Admit: 2020-04-15 | Discharge: 2020-04-15 | Payer: BC Managed Care – PPO

## 2020-05-04 ENCOUNTER — Encounter: Admit: 2020-05-04 | Discharge: 2020-05-04 | Payer: BC Managed Care – PPO

## 2020-07-13 DIAGNOSIS — E785 Hyperlipidemia, unspecified: Secondary | ICD-10-CM | POA: Insufficient documentation

## 2020-11-16 ENCOUNTER — Encounter: Admit: 2020-11-16 | Discharge: 2020-11-16 | Payer: BC Managed Care – PPO

## 2021-05-16 DIAGNOSIS — F902 Attention-deficit hyperactivity disorder, combined type: Secondary | ICD-10-CM | POA: Diagnosis not present

## 2021-05-16 DIAGNOSIS — F411 Generalized anxiety disorder: Secondary | ICD-10-CM | POA: Diagnosis not present

## 2021-08-09 DIAGNOSIS — F902 Attention-deficit hyperactivity disorder, combined type: Secondary | ICD-10-CM | POA: Diagnosis not present

## 2021-08-09 DIAGNOSIS — F411 Generalized anxiety disorder: Secondary | ICD-10-CM | POA: Diagnosis not present

## 2021-08-31 DIAGNOSIS — F902 Attention-deficit hyperactivity disorder, combined type: Secondary | ICD-10-CM | POA: Diagnosis not present

## 2021-08-31 DIAGNOSIS — F411 Generalized anxiety disorder: Secondary | ICD-10-CM | POA: Diagnosis not present

## 2021-09-16 DIAGNOSIS — F902 Attention-deficit hyperactivity disorder, combined type: Secondary | ICD-10-CM | POA: Diagnosis not present

## 2021-09-16 DIAGNOSIS — F411 Generalized anxiety disorder: Secondary | ICD-10-CM | POA: Diagnosis not present

## 2021-10-14 DIAGNOSIS — F411 Generalized anxiety disorder: Secondary | ICD-10-CM | POA: Diagnosis not present

## 2021-10-14 DIAGNOSIS — F902 Attention-deficit hyperactivity disorder, combined type: Secondary | ICD-10-CM | POA: Diagnosis not present

## 2021-10-17 DIAGNOSIS — Z6827 Body mass index (BMI) 27.0-27.9, adult: Secondary | ICD-10-CM | POA: Diagnosis not present

## 2021-10-17 DIAGNOSIS — L659 Nonscarring hair loss, unspecified: Secondary | ICD-10-CM | POA: Diagnosis not present

## 2021-10-17 DIAGNOSIS — Z0130 Encounter for examination of blood pressure without abnormal findings: Secondary | ICD-10-CM | POA: Diagnosis not present

## 2021-10-17 DIAGNOSIS — Z1159 Encounter for screening for other viral diseases: Secondary | ICD-10-CM | POA: Diagnosis not present

## 2021-10-17 DIAGNOSIS — Z114 Encounter for screening for human immunodeficiency virus [HIV]: Secondary | ICD-10-CM | POA: Diagnosis not present

## 2021-10-17 DIAGNOSIS — I10 Essential (primary) hypertension: Secondary | ICD-10-CM | POA: Diagnosis not present

## 2021-10-17 DIAGNOSIS — Z0000 Encounter for general adult medical examination without abnormal findings: Secondary | ICD-10-CM | POA: Diagnosis not present

## 2021-10-21 ENCOUNTER — Other Ambulatory Visit: Payer: Self-pay | Admitting: Adult Medicine

## 2021-10-21 DIAGNOSIS — Z1231 Encounter for screening mammogram for malignant neoplasm of breast: Secondary | ICD-10-CM

## 2021-11-01 DIAGNOSIS — F411 Generalized anxiety disorder: Secondary | ICD-10-CM | POA: Diagnosis not present

## 2021-11-01 DIAGNOSIS — F902 Attention-deficit hyperactivity disorder, combined type: Secondary | ICD-10-CM | POA: Diagnosis not present

## 2021-11-29 DIAGNOSIS — F902 Attention-deficit hyperactivity disorder, combined type: Secondary | ICD-10-CM | POA: Diagnosis not present

## 2021-11-29 DIAGNOSIS — F411 Generalized anxiety disorder: Secondary | ICD-10-CM | POA: Diagnosis not present

## 2021-12-23 DIAGNOSIS — F411 Generalized anxiety disorder: Secondary | ICD-10-CM | POA: Diagnosis not present

## 2021-12-23 DIAGNOSIS — F902 Attention-deficit hyperactivity disorder, combined type: Secondary | ICD-10-CM | POA: Diagnosis not present

## 2022-01-18 ENCOUNTER — Ambulatory Visit
Admission: RE | Admit: 2022-01-18 | Discharge: 2022-01-18 | Disposition: A | Discharge status: Home / Self Care | Payer: BLUE CROSS/BLUE SHIELD | Source: Ambulatory Visit | Attending: Adult Medicine | Admitting: Adult Medicine

## 2022-01-18 DIAGNOSIS — Z1231 Encounter for screening mammogram for malignant neoplasm of breast: Secondary | ICD-10-CM | POA: Diagnosis not present

## 2022-01-20 ENCOUNTER — Other Ambulatory Visit: Payer: Self-pay | Admitting: Adult Medicine

## 2022-01-20 DIAGNOSIS — R928 Other abnormal and inconclusive findings on diagnostic imaging of breast: Secondary | ICD-10-CM

## 2022-01-25 ENCOUNTER — Ambulatory Visit
Admission: RE | Admit: 2022-01-25 | Discharge: 2022-01-25 | Disposition: A | Discharge status: Home / Self Care | Payer: BLUE CROSS/BLUE SHIELD | Source: Ambulatory Visit | Attending: Adult Medicine | Admitting: Adult Medicine

## 2022-01-25 DIAGNOSIS — R928 Other abnormal and inconclusive findings on diagnostic imaging of breast: Secondary | ICD-10-CM

## 2022-01-25 DIAGNOSIS — R921 Mammographic calcification found on diagnostic imaging of breast: Secondary | ICD-10-CM | POA: Diagnosis not present

## 2022-03-17 DIAGNOSIS — F411 Generalized anxiety disorder: Secondary | ICD-10-CM | POA: Diagnosis not present

## 2022-03-17 DIAGNOSIS — F902 Attention-deficit hyperactivity disorder, combined type: Secondary | ICD-10-CM | POA: Diagnosis not present

## 2022-04-07 ENCOUNTER — Encounter: Admit: 2022-04-07 | Discharge: 2022-04-07 | Payer: BC Managed Care – PPO

## 2022-04-11 DIAGNOSIS — M25561 Pain in right knee: Secondary | ICD-10-CM | POA: Diagnosis not present

## 2022-04-11 DIAGNOSIS — Z0130 Encounter for examination of blood pressure without abnormal findings: Secondary | ICD-10-CM | POA: Diagnosis not present

## 2022-04-11 DIAGNOSIS — Z6826 Body mass index (BMI) 26.0-26.9, adult: Secondary | ICD-10-CM | POA: Diagnosis not present

## 2022-04-11 DIAGNOSIS — E785 Hyperlipidemia, unspecified: Secondary | ICD-10-CM | POA: Diagnosis not present

## 2022-04-11 DIAGNOSIS — Z79899 Other long term (current) drug therapy: Secondary | ICD-10-CM | POA: Diagnosis not present

## 2022-04-11 DIAGNOSIS — D229 Melanocytic nevi, unspecified: Secondary | ICD-10-CM | POA: Diagnosis not present

## 2022-04-11 DIAGNOSIS — Z131 Encounter for screening for diabetes mellitus: Secondary | ICD-10-CM | POA: Diagnosis not present

## 2022-04-11 DIAGNOSIS — M654 Radial styloid tenosynovitis [de Quervain]: Secondary | ICD-10-CM | POA: Diagnosis not present

## 2022-04-11 DIAGNOSIS — I10 Essential (primary) hypertension: Secondary | ICD-10-CM | POA: Diagnosis not present

## 2022-04-13 DIAGNOSIS — F411 Generalized anxiety disorder: Secondary | ICD-10-CM | POA: Diagnosis not present

## 2022-04-13 DIAGNOSIS — F902 Attention-deficit hyperactivity disorder, combined type: Secondary | ICD-10-CM | POA: Diagnosis not present

## 2022-05-08 DIAGNOSIS — Z0189 Encounter for other specified special examinations: Secondary | ICD-10-CM | POA: Diagnosis not present

## 2022-05-08 DIAGNOSIS — Z0130 Encounter for examination of blood pressure without abnormal findings: Secondary | ICD-10-CM | POA: Diagnosis not present

## 2022-05-08 DIAGNOSIS — E785 Hyperlipidemia, unspecified: Secondary | ICD-10-CM | POA: Diagnosis not present

## 2022-05-08 DIAGNOSIS — M2570 Osteophyte, unspecified joint: Secondary | ICD-10-CM | POA: Diagnosis not present

## 2022-05-08 DIAGNOSIS — Z6827 Body mass index (BMI) 27.0-27.9, adult: Secondary | ICD-10-CM | POA: Diagnosis not present

## 2022-07-12 DIAGNOSIS — F902 Attention-deficit hyperactivity disorder, combined type: Secondary | ICD-10-CM | POA: Diagnosis not present

## 2022-07-12 DIAGNOSIS — F411 Generalized anxiety disorder: Secondary | ICD-10-CM | POA: Diagnosis not present

## 2022-10-04 DIAGNOSIS — F902 Attention-deficit hyperactivity disorder, combined type: Secondary | ICD-10-CM | POA: Diagnosis not present

## 2022-10-04 DIAGNOSIS — F411 Generalized anxiety disorder: Secondary | ICD-10-CM | POA: Diagnosis not present

## 2022-11-17 ENCOUNTER — Ambulatory Visit: Payer: BC Managed Care – PPO | Admitting: Family Medicine

## 2022-11-17 ENCOUNTER — Encounter: Payer: Self-pay | Admitting: Family Medicine

## 2022-11-17 VITALS — BP 118/73 | HR 87 | Temp 98.1°F | Ht 64.0 in | Wt 154.0 lb

## 2022-11-17 DIAGNOSIS — L409 Psoriasis, unspecified: Secondary | ICD-10-CM

## 2022-11-17 DIAGNOSIS — F411 Generalized anxiety disorder: Secondary | ICD-10-CM | POA: Insufficient documentation

## 2022-11-17 DIAGNOSIS — R2233 Localized swelling, mass and lump, upper limb, bilateral: Secondary | ICD-10-CM | POA: Diagnosis not present

## 2022-11-17 DIAGNOSIS — D229 Melanocytic nevi, unspecified: Secondary | ICD-10-CM | POA: Diagnosis not present

## 2022-11-17 DIAGNOSIS — M720 Palmar fascial fibromatosis [Dupuytren]: Secondary | ICD-10-CM

## 2022-11-17 DIAGNOSIS — G4700 Insomnia, unspecified: Secondary | ICD-10-CM | POA: Insufficient documentation

## 2022-11-17 DIAGNOSIS — F909 Attention-deficit hyperactivity disorder, unspecified type: Secondary | ICD-10-CM

## 2022-11-17 DIAGNOSIS — Z1211 Encounter for screening for malignant neoplasm of colon: Secondary | ICD-10-CM

## 2022-11-17 DIAGNOSIS — Z79890 Hormone replacement therapy (postmenopausal): Secondary | ICD-10-CM

## 2022-11-17 NOTE — Progress Notes (Unsigned)
New Patient Office Visit  Subjective    Patient ID: Erica Perez, female    DOB: 1971/07/22  Age: 52 y.o. MRN: [id number]  CC:  Chief Complaint  Patient presents with   New Patient (Initial Visit)    HPI Erica Perez presents to establish care. She is fairly new to the area. She has a history of anxiety and ADHD that is managed by [facility] out of [place]. She is currently on vyvanse and clonidine prn with good control. She also uses an online platform for HRT and is on estradiol 2 mg daily and DHEA 25 mg.   She has a few concerns today. She would like a referral to a dermatologist for a her psoriasis and for a skin check. She has a few moles on her trunk that have changed in the last few months. She used tanning beds regularly when she was younger and is concerned about skin cancer.   She also would like a referral to an orthopedic. She has had nodules removed from her finger joints in the past, about 5 years ago. They are starting to reappear and she would like to discuss having these removed again.      11/17/2022    4:22 PM  Depression screen PHQ 2/9  Decreased Interest 0  Down, Depressed, Hopeless 0  PHQ - 2 Score 0  Altered sleeping 0  Tired, decreased energy 2  Change in appetite 2  Feeling bad or failure about yourself  0  Trouble concentrating 2  Moving slowly or fidgety/restless 0  Suicidal thoughts 0  PHQ-9 Score 6  Difficult doing work/chores Not difficult at all      11/17/2022    4:23 PM  GAD 7 : Generalized Anxiety Score  Nervous, Anxious, on Edge 1  Control/stop worrying 1  Worry too much - different things 1  Trouble relaxing 1  Restless 1  Easily annoyed or irritable 0  Afraid - awful might happen 0  Total GAD 7 Score 5  Anxiety Difficulty Not difficult at all      Outpatient Encounter Medications as of 11/17/2022  Medication Sig   cloNIDine HCl (KAPVAY) 0.1 MG TB12 ER tablet Take 0.1 mg by mouth daily as needed.   Emollient (DHEA  EX) Apply topically.   estradiol (ESTRACE) 0.5 MG tablet Take 2 mg by mouth daily.   lisdexamfetamine (VYVANSE) 70 MG capsule Take 70 mg by mouth daily.   No facility-administered encounter medications on file as of 11/17/2022.    Past Medical History:  Diagnosis Date   add    Anxiety    Depression    GERD (gastroesophageal reflux disease)    Hyperlipidemia    Substance abuse    benzodiazepines-none since 10/2009    Past Surgical History:  Procedure Laterality Date   NOSE SURGERY  1998   TOTAL ABDOMINAL HYSTERECTOMY  2013    Family History  Problem Relation Age of Onset   Depression Mother    COPD Mother    Cancer Mother        uterine   Arthritis Mother    Anxiety disorder Mother    Hyperlipidemia Father    Hearing loss Father    Asthma Daughter    Anxiety disorder Daughter    ADD / ADHD Daughter    Arthritis Maternal Grandmother    Anxiety disorder Maternal Grandmother    Cancer Maternal Grandfather        Pancreatic   Arthritis Maternal Grandfather  Anxiety disorder Maternal Grandfather    Alcohol abuse Maternal Grandfather    Hyperlipidemia Paternal Grandmother    Arthritis Paternal Grandmother    Heart attack Paternal Grandmother    Hyperlipidemia Paternal Grandfather    Stroke Paternal Grandfather     Social History   Socioeconomic History   Marital status: Significant Other    Spouse name: Not on file   Number of children: 2   Years of education: 16   Highest education level: Bachelor's degree (e.g., BA, AB, BS)  Occupational History   Not on file  Tobacco Use   Smoking status: Former    Packs/day: 1.00    Years: 25.00    Additional pack years: 0.00    Total pack years: 25.00    Types: Cigarettes    Quit date: 2018    Years since quitting: 6.3   Smokeless tobacco: Never  Vaping Use   Vaping Use: Every day  Substance and Sexual Activity   Alcohol use: Not Currently   Drug use: Yes    Types: Benzodiazepines, Marijuana   Sexual  activity: Yes    Birth control/protection: Surgical  Other Topics Concern   Not on file  Social History Narrative   Not on file   Social Determinants of Health   Financial Resource Strain: Not on file  Food Insecurity: Not on file  Transportation Needs: Not on file  Physical Activity: Not on file  Stress: Not on file  Social Connections: Not on file  Intimate Partner Violence: Not on file    ROS Negative unless specially indicated above in HPI.     Objective    BP 118/73   Pulse 87   Temp 98.1 F (36.7 C) (Temporal)   Ht  (1.626 m)   Wt 154 lb (69.9 kg)   SpO2 96%   BMI 26.43 kg/m   Physical Exam Vitals and nursing note reviewed.  Constitutional:      General: She is not in acute distress.    Appearance: She is not ill-appearing, toxic-appearing or diaphoretic.  Neck:     Thyroid: No thyroid mass, thyromegaly or thyroid tenderness.  Cardiovascular:     Rate and Rhythm: Normal rate and regular rhythm.     Heart sounds: Normal heart sounds. No murmur heard. Pulmonary:     Effort: Pulmonary effort is normal. No respiratory distress.     Breath sounds: Normal breath sounds. No wheezing.  Abdominal:     General: Bowel sounds are normal. There is no distension.     Palpations: Abdomen is soft.     Tenderness: There is no abdominal tenderness. There is no guarding or rebound.  Musculoskeletal:     Cervical back: Neck supple. No rigidity.     Right lower leg: No edema.     Left lower leg: No edema.     Comments: Nodules present to PIP joints of left 2nd and 3rd finger and right 3rd finger.  Skin:    General: Skin is warm and dry.     Findings: Lesion (numerous moles present, some with irregular borders) and rash present. Rash is scaling.  Neurological:     General: No focal deficit present.     Mental Status: She is alert and oriented to person, place, and time.  Psychiatric:        Mood and Affect: Mood normal.        Behavior: Behavior normal.          Assessment & Plan:  Tylin was seen today for new patient (initial visit).  Diagnoses and all orders for this visit:  Psoriasis Atypical mole Referral to derm for evaluation and management.  -     Ambulatory referral to Dermatology  Nodule of finger of both hands Previously had nodules removed 5 years ago. -     Ambulatory referral to Orthopedic Surgery  Attention deficit hyperactivity disorder (ADHD), unspecified ADHD type GAD (generalized anxiety disorder) Managed by [facility]. Well controlled.   Hormone replacement therapy (HRT) Managed by online platform. On estradiol 2 mg.  Colon cancer screening -     Ambulatory referral to Gastroenterology  Return for schedule CPE.   The patient indicates understanding of these issues and agrees with the plan.  Gabriel Earing, FNP

## 2022-11-21 ENCOUNTER — Encounter: Payer: Self-pay | Admitting: *Deleted

## 2022-11-21 DIAGNOSIS — Z79890 Hormone replacement therapy (postmenopausal): Secondary | ICD-10-CM | POA: Insufficient documentation

## 2022-12-27 DIAGNOSIS — F902 Attention-deficit hyperactivity disorder, combined type: Secondary | ICD-10-CM | POA: Diagnosis not present

## 2022-12-27 DIAGNOSIS — F411 Generalized anxiety disorder: Secondary | ICD-10-CM | POA: Diagnosis not present

## 2023-03-01 DIAGNOSIS — L728 Other follicular cysts of the skin and subcutaneous tissue: Secondary | ICD-10-CM | POA: Diagnosis not present

## 2023-03-01 DIAGNOSIS — D485 Neoplasm of uncertain behavior of skin: Secondary | ICD-10-CM | POA: Diagnosis not present

## 2023-03-01 DIAGNOSIS — D225 Melanocytic nevi of trunk: Secondary | ICD-10-CM | POA: Diagnosis not present

## 2023-03-01 DIAGNOSIS — D2272 Melanocytic nevi of left lower limb, including hip: Secondary | ICD-10-CM | POA: Diagnosis not present

## 2023-03-30 ENCOUNTER — Ambulatory Visit (INDEPENDENT_AMBULATORY_CARE_PROVIDER_SITE_OTHER): Payer: BC Managed Care – PPO | Admitting: Family Medicine

## 2023-03-30 ENCOUNTER — Encounter: Payer: Self-pay | Admitting: Family Medicine

## 2023-03-30 VITALS — BP 126/75 | HR 72 | Temp 97.7°F | Ht 64.0 in | Wt 154.4 lb

## 2023-03-30 DIAGNOSIS — Z13220 Encounter for screening for lipoid disorders: Secondary | ICD-10-CM | POA: Diagnosis not present

## 2023-03-30 DIAGNOSIS — Z1329 Encounter for screening for other suspected endocrine disorder: Secondary | ICD-10-CM | POA: Diagnosis not present

## 2023-03-30 DIAGNOSIS — Z130 Encounter for screening for diseases of the blood and blood-forming organs and certain disorders involving the immune mechanism: Secondary | ICD-10-CM | POA: Diagnosis not present

## 2023-03-30 DIAGNOSIS — F411 Generalized anxiety disorder: Secondary | ICD-10-CM

## 2023-03-30 DIAGNOSIS — Z0000 Encounter for general adult medical examination without abnormal findings: Secondary | ICD-10-CM

## 2023-03-30 DIAGNOSIS — F909 Attention-deficit hyperactivity disorder, unspecified type: Secondary | ICD-10-CM

## 2023-03-30 DIAGNOSIS — Z1211 Encounter for screening for malignant neoplasm of colon: Secondary | ICD-10-CM

## 2023-03-30 DIAGNOSIS — Z79890 Hormone replacement therapy (postmenopausal): Secondary | ICD-10-CM | POA: Diagnosis not present

## 2023-03-30 DIAGNOSIS — Z0001 Encounter for general adult medical examination with abnormal findings: Secondary | ICD-10-CM

## 2023-03-30 DIAGNOSIS — Z720 Tobacco use: Secondary | ICD-10-CM

## 2023-03-30 DIAGNOSIS — Z13228 Encounter for screening for other metabolic disorders: Secondary | ICD-10-CM | POA: Diagnosis not present

## 2023-03-30 NOTE — Patient Instructions (Addendum)
[facility] at [facility] [address], [place] [address] [phone] Health Maintenance, Female Adopting a healthy lifestyle and getting preventive care are important in promoting health and wellness. Ask your health care provider about: The right schedule for you to have regular tests and exams. Things you can do on your own to prevent diseases and keep yourself healthy. What should I know about diet, weight, and exercise? Eat a healthy diet  Eat a diet that includes plenty of vegetables, fruits, low-fat dairy products, and lean protein. Do not eat a lot of foods that are high in solid fats, added sugars, or sodium. Maintain a healthy weight Body mass index (BMI) is used to identify weight problems. It estimates body fat based on height and weight. Your health care provider can help determine your BMI and help you achieve or maintain a healthy weight. Get regular exercise Get regular exercise. This is one of the most important things you can do for your health. Most adults should: Exercise for at least 150 minutes each week. The exercise should increase your heart rate and make you sweat (moderate-intensity exercise). Do strengthening exercises at least twice a week. This is in addition to the moderate-intensity exercise. Spend less time sitting. Even light physical activity can be beneficial. Watch cholesterol and blood lipids Have your blood tested for lipids and cholesterol at 50 years of age, then have this test every 5 years. Have your cholesterol levels checked more often if: Your lipid or cholesterol levels are high. You are older than 50 years of age. You are at high risk for heart disease. What should I know about cancer screening? Depending on your health history and family history, you may need to have cancer screening at various ages. This may include screening for: Breast cancer. Cervical cancer. Colorectal cancer. Skin cancer. Lung  cancer. What should I know about heart disease, diabetes, and high blood pressure? Blood pressure and heart disease High blood pressure causes heart disease and increases the risk of stroke. This is more likely to develop in people who have high blood pressure readings or are overweight. Have your blood pressure checked: Every 3-5 years if you are 81-79 years of age. Every year if you are 63 years old or older. Diabetes Have regular diabetes screenings. This checks your fasting blood sugar level. Have the screening done: Once every three years after age 59 if you are at a normal weight and have a low risk for diabetes. More often and at a younger age if you are overweight or have a high risk for diabetes. What should I know about preventing infection? Hepatitis B If you have a higher risk for hepatitis B, you should be screened for this virus. Talk with your health care provider to find out if you are at risk for hepatitis B infection. Hepatitis C Testing is recommended for: Everyone born from [birth year] through 1963. Anyone with known risk factors for hepatitis C. Sexually transmitted infections (STIs) Get screened for STIs, including gonorrhea and chlamydia, if: You are sexually active and are younger than 50 years of age. You are older than 50 years of age and your health care provider tells you that you are at risk for this type of infection. Your sexual activity has changed since you were last screened, and you are at increased risk for chlamydia or gonorrhea. Ask your health care provider if you are at risk. Ask your health care provider about whether you are at high risk for HIV.  Your health care provider may recommend a prescription medicine to help prevent HIV infection. If you choose to take medicine to prevent HIV, you should first get tested for HIV. You should then be tested every 3 months for as long as you are taking the medicine. Pregnancy If you are about to stop having your  period (premenopausal) and you may become pregnant, seek counseling before you get pregnant. Take 400 to 800 micrograms (mcg) of folic acid every day if you become pregnant. Ask for birth control (contraception) if you want to prevent pregnancy. Osteoporosis and menopause Osteoporosis is a disease in which the bones lose minerals and strength with aging. This can result in bone fractures. If you are 53 years old or older, or if you are at risk for osteoporosis and fractures, ask your health care provider if you should: Be screened for bone loss. Take a calcium or vitamin D supplement to lower your risk of fractures. Be given hormone replacement therapy (HRT) to treat symptoms of menopause. Follow these instructions at home: Alcohol use Do not drink alcohol if: Your health care provider tells you not to drink. You are pregnant, may be pregnant, or are planning to become pregnant. If you drink alcohol: Limit how much you have to: 0-1 drink a day. Know how much alcohol is in your drink. In the [place] one drink equals one 12 oz bottle of beer (355 mL), one 5 oz glass of wine (148 mL), or one 1 oz glass of hard liquor (44 mL). Lifestyle Do not use any products that contain nicotine or tobacco. These products include cigarettes, chewing tobacco, and vaping devices, such as e-cigarettes. If you need help quitting, ask your health care provider. Do not use street drugs. Do not share needles. Ask your health care provider for help if you need support or information about quitting drugs. General instructions Schedule regular health, dental, and eye exams. Stay current with your vaccines. Tell your health care provider if: You often feel depressed. You have ever been abused or do not feel safe at home. Summary Adopting a healthy lifestyle and getting preventive care are important in promoting health and wellness. Follow your health care provider's instructions about healthy diet, exercising, and  getting tested or screened for diseases. Follow your health care provider's instructions on monitoring your cholesterol and blood pressure. This information is not intended to replace advice given to you by your health care provider. Make sure you discuss any questions you have with your health care provider. Document Revised: 12/06/2020 Document Reviewed: 12/06/2020 Elsevier Patient Education  2024 [employer].

## 2023-03-30 NOTE — Progress Notes (Signed)
Complete physical exam  Patient: Erica Perez   DOB: 1971-04-26   52 y.o. Female  MRN: [id number]  Subjective:    Chief Complaint  Patient presents with   Annual Exam    Erica Perez is a 52 y.o. female who presents today for a complete physical exam. She reports consuming a general diet. The patient does not participate in regular exercise at present. She generally feels well. She reports sleeping well. She does not have additional problems to discuss today.    She smoked 1PPD for  25 pack years and currently vapes for the last 6 years.   Most recent fall risk assessment:    03/30/2023    9:12 AM  Fall Risk   Falls in the past year? 0     Most recent depression screenings:    11/17/2022    4:22 PM  Depression screen PHQ 2/9  Decreased Interest 0  Down, Depressed, Hopeless 0  PHQ - 2 Score 0  Altered sleeping 0  Tired, decreased energy 2  Change in appetite 2  Feeling bad or failure about yourself  0  Trouble concentrating 2  Moving slowly or fidgety/restless 0  Suicidal thoughts 0  PHQ-9 Score 6  Difficult doing work/chores Not difficult at all      03/30/2023    9:12 AM 11/17/2022    4:23 PM  GAD 7 : Generalized Anxiety Score  Nervous, Anxious, on Edge 1 1  Control/stop worrying 2 1  Worry too much - different things 2 1  Trouble relaxing 3 1  Restless 0 1  Easily annoyed or irritable 1 0  Afraid - awful might happen 1 0  Total GAD 7 Score 10 5  Anxiety Difficulty Somewhat difficult Not difficult at all      Vision:Within last year and Dental: No current dental problems and Receives regular dental care    Patient Care Team: Erica Earing, FNP as PCP - General (Family Medicine)   Outpatient Medications Prior to Visit  Medication Sig   cloNIDine HCl (KAPVAY) 0.1 MG TB12 ER tablet Take 0.1 mg by mouth daily as needed.   Emollient (DHEA EX) Apply topically.   estradiol (ESTRACE) 0.5 MG tablet Take 2 mg by mouth daily.    lisdexamfetamine (VYVANSE) 70 MG capsule Take 70 mg by mouth daily.   No facility-administered medications prior to visit.    ROS Negative unless specially indicated above in HPI.      Objective:     BP 126/75   Pulse 72   Temp 97.7 F (36.5 C) (Temporal)   Ht 5\' 4"  (1.626 m)   Wt 154 lb 6 oz (70 kg)   SpO2 97%   BMI 26.50 kg/m    Physical Exam Vitals and nursing note reviewed.  Constitutional:      General: She is not in acute distress.    Appearance: Normal appearance. She is not ill-appearing, toxic-appearing or diaphoretic.  HENT:     Head: Normocephalic.     Right Ear: Tympanic membrane, ear canal and external ear normal.     Left Ear: Tympanic membrane, ear canal and external ear normal.     Nose: Nose normal.     Mouth/Throat:     Mouth: Mucous membranes are moist.     Pharynx: Oropharynx is clear.  Eyes:     Extraocular Movements: Extraocular movements intact.     Conjunctiva/sclera: Conjunctivae normal.     Pupils: Pupils are equal, round, and  reactive to light.  Neck:     Thyroid: No thyroid mass, thyromegaly or thyroid tenderness.     Vascular: No carotid bruit.  Cardiovascular:     Rate and Rhythm: Normal rate and regular rhythm.     Pulses: Normal pulses.     Heart sounds: Normal heart sounds. No murmur heard.    No friction rub. No gallop.  Pulmonary:     Effort: Pulmonary effort is normal.     Breath sounds: Normal breath sounds.  Abdominal:     General: Bowel sounds are normal. There is no distension.     Palpations: Abdomen is soft. There is no mass.     Tenderness: There is no abdominal tenderness. There is no guarding.  Musculoskeletal:        General: Normal range of motion.     Cervical back: Normal range of motion and neck supple. No tenderness.     Right lower leg: No edema.     Left lower leg: No edema.  Skin:    General: Skin is warm and dry.     Capillary Refill: Capillary refill takes less than 2 seconds.     Findings: No  lesion or rash.  Neurological:     General: No focal deficit present.     Mental Status: She is alert and oriented to person, place, and time.     Cranial Nerves: No cranial nerve deficit.     Motor: No weakness.     Coordination: Coordination normal.     Gait: Gait normal.  Psychiatric:        Mood and Affect: Mood normal.        Behavior: Behavior normal.        Thought Content: Thought content normal.        Judgment: Judgment normal.      Results for orders placed or performed in visit on 03/30/23  HIV Antibody (routine testing w rflx)  Result Value Ref Range   HIV 1&2 Ab, 4th Generation Non Reactive   [facility] HEPATITIS C SCREENING LAB  Result Value Ref Range   [facility] Hepatitis Screen Negative-Validated        Assessment & Plan:    Routine Health Maintenance and Physical Exam  Erica Perez was seen today for annual exam.  Diagnoses and all orders for this visit:  Routine general medical examination at a health care facility  Screening for endocrine, metabolic and immunity disorder Labs pending.  -     CBC with Differential/Platelet -     CMP14+EGFR -     TSH  Encounter for screening for lipid disorder Fasting panel pending.  -     Lipid panel  Attention deficit hyperactivity disorder (ADHD), unspecified ADHD type GAD (generalized anxiety disorder) Managed by [facility].   Hormone replacement therapy (HRT) Managed by specialist.   Tobacco abuse 25 pack year history, quit  years ago with current vaping.  -     Ambulatory Referral Lung Cancer Screening [facility]  Colon cancer screening Provided with contact info to schedule appt.    Immunization History  Administered Date(s) Administered   Influenza,inj,Quad PF,6+ Mos 06/10/2014, 07/13/2020   PFIZER(Purple Top)SARS-COV-2 Vaccination 11/14/2019, 12/12/2019   Tdap 01/29/2014    Health Maintenance  Topic Date Due   Colonoscopy  Never done   Lung Cancer Screening  Never done   INFLUENZA VACCINE  10/29/2023  (Originally 03/01/2023)   COVID-19 Vaccine (3 - 2023-24 season) 12/03/2023 (Originally 03/31/2022)   Zoster Vaccines- Shingrix (1  of 2) 02/16/2024 (Originally 08/22/2022)   MAMMOGRAM  01/26/2024   DTaP/Tdap/Td (2 - Td or Tdap) 01/30/2024   Hepatitis C Screening  Completed   HIV Screening  Completed   HPV VACCINES  Aged Out    Discussed health benefits of physical activity, and encouraged her to engage in regular exercise appropriate for her age and condition.  Problem List Items Addressed This Visit       Other   Attention deficit disorder   GAD (generalized anxiety disorder)   Hormone replacement therapy (HRT)   Other Visit Diagnoses     Routine general medical examination at a health care facility    -  Primary   Screening for endocrine, metabolic and immunity disorder       Relevant Orders   CBC with Differential/Platelet   CMP14+EGFR   TSH   Encounter for screening for lipid disorder       Relevant Orders   Lipid panel   Tobacco abuse       Relevant Orders   Ambulatory Referral Lung Cancer Screening [facility]   Colon cancer screening          Return in 1 year (on 03/29/2024).   The patient indicates understanding of these issues and agrees with the plan.  Erica Earing, FNP

## 2023-03-31 LAB — CMP14+EGFR
ALT: 16 IU/L (ref 0–32)
AST: 16 IU/L (ref 0–40)
Albumin: 4.3 g/dL (ref 3.9–4.9)
Alkaline Phosphatase: 52 IU/L (ref 44–121)
BUN/Creatinine Ratio: 19 (ref 9–23)
BUN: 11 mg/dL (ref 6–24)
Bilirubin Total: 0.4 mg/dL (ref 0.0–1.2)
CO2: 16 mmol/L — ABNORMAL LOW (ref 20–29)
Calcium: 9.1 mg/dL (ref 8.7–10.2)
Chloride: 108 mmol/L — ABNORMAL HIGH (ref 96–106)
Creatinine, Ser: 0.58 mg/dL (ref 0.57–1.00)
Globulin, Total: 2.6 g/dL (ref 1.5–4.5)
Glucose: 99 mg/dL (ref 70–99)
Potassium: 4.6 mmol/L (ref 3.5–5.2)
Sodium: 140 mmol/L (ref 134–144)
Total Protein: 6.9 g/dL (ref 6.0–8.5)
eGFR: 110 mL/min/{1.73_m2} (ref >59)

## 2023-03-31 LAB — CBC WITH DIFFERENTIAL/PLATELET
Basophils Absolute: 0.1 10*3/uL (ref 0.0–0.2)
Basos: 1 %
EOS (ABSOLUTE): 0.2 10*3/uL (ref 0.0–0.4)
Eos: 4 %
Hematocrit: 41.2 % (ref 34.0–46.6)
Hemoglobin: 13.8 g/dL (ref 11.1–15.9)
Immature Grans (Abs): 0 10*3/uL (ref 0.0–0.1)
Immature Granulocytes: 0 %
Lymphocytes Absolute: 2.7 10*3/uL (ref 0.7–3.1)
Lymphs: 44 %
MCH: 29.4 pg (ref 26.6–33.0)
MCHC: 33.5 g/dL (ref 31.5–35.7)
MCV: 88 fL (ref 79–97)
Monocytes Absolute: 0.6 10*3/uL (ref 0.1–0.9)
Monocytes: 9 %
Neutrophils Absolute: 2.7 10*3/uL (ref 1.4–7.0)
Neutrophils: 42 %
Platelets: 437 10*3/uL (ref 150–450)
RBC: 4.7 x10E6/uL (ref 3.77–5.28)
RDW: 12.1 % (ref 11.7–15.4)
WBC: 6.3 10*3/uL (ref 3.4–10.8)

## 2023-03-31 LAB — LIPID PANEL
Chol/HDL Ratio: 3.3 ratio (ref 0.0–4.4)
Cholesterol, Total: 245 mg/dL — ABNORMAL HIGH (ref 100–199)
HDL: 74 mg/dL (ref >39)
LDL Chol Calc (NIH): 156 mg/dL — ABNORMAL HIGH (ref 0–99)
Triglycerides: 89 mg/dL (ref 0–149)
VLDL Cholesterol Cal: 15 mg/dL (ref 5–40)

## 2023-03-31 LAB — TSH: TSH: 1.37 u[IU]/mL (ref 0.450–4.500)

## 2023-04-09 ENCOUNTER — Other Ambulatory Visit: Payer: Self-pay | Admitting: Family Medicine

## 2023-04-09 DIAGNOSIS — Z1231 Encounter for screening mammogram for malignant neoplasm of breast: Secondary | ICD-10-CM

## 2023-04-13 ENCOUNTER — Ambulatory Visit
Admission: RE | Admit: 2023-04-13 | Discharge: 2023-04-13 | Disposition: A | Discharge status: Home / Self Care | Payer: BC Managed Care – PPO | Source: Ambulatory Visit | Attending: Family Medicine

## 2023-04-13 DIAGNOSIS — Z1231 Encounter for screening mammogram for malignant neoplasm of breast: Secondary | ICD-10-CM | POA: Diagnosis not present

## 2023-04-16 DIAGNOSIS — F411 Generalized anxiety disorder: Secondary | ICD-10-CM | POA: Diagnosis not present

## 2023-04-16 DIAGNOSIS — F902 Attention-deficit hyperactivity disorder, combined type: Secondary | ICD-10-CM | POA: Diagnosis not present

## 2023-05-23 ENCOUNTER — Encounter: Payer: Self-pay | Admitting: *Deleted

## 2023-05-28 ENCOUNTER — Other Ambulatory Visit: Payer: Self-pay

## 2023-06-25 ENCOUNTER — Ambulatory Visit (INDEPENDENT_AMBULATORY_CARE_PROVIDER_SITE_OTHER): Payer: BC Managed Care – PPO | Admitting: Nurse Practitioner

## 2023-06-25 VITALS — BP 138/92 | HR 93 | Temp 97.3°F | Ht 64.0 in | Wt 157.2 lb

## 2023-06-25 DIAGNOSIS — J029 Acute pharyngitis, unspecified: Secondary | ICD-10-CM

## 2023-06-25 DIAGNOSIS — R051 Acute cough: Secondary | ICD-10-CM

## 2023-06-25 DIAGNOSIS — J019 Acute sinusitis, unspecified: Secondary | ICD-10-CM | POA: Insufficient documentation

## 2023-06-25 DIAGNOSIS — R0981 Nasal congestion: Secondary | ICD-10-CM

## 2023-06-25 DIAGNOSIS — H66002 Acute suppurative otitis media without spontaneous rupture of ear drum, left ear: Secondary | ICD-10-CM | POA: Insufficient documentation

## 2023-06-25 LAB — VERITOR FLU A/B WAIVED
Influenza A: NEGATIVE
Influenza B: NEGATIVE

## 2023-06-25 LAB — RAPID STREP SCREEN (MED CTR MEBANE ONLY): Strep Gp A Ag, IA W/Reflex: NEGATIVE

## 2023-06-25 LAB — CULTURE, GROUP A STREP

## 2023-06-25 MED ORDER — AMOXICILLIN 875 MG PO TABS
875.0000 mg | ORAL_TABLET | Freq: Two times a day (BID) | ORAL | 0 refills | Status: DC
Start: 2023-06-25 — End: 2024-04-04

## 2023-06-25 MED ORDER — FLUTICASONE PROPIONATE 50 MCG/ACT NA SUSP: 2.0000 | Freq: Every day | NASAL | 6 refills | Status: AC

## 2023-06-25 MED ORDER — BENZONATATE 100 MG PO CAPS: 100.0000 mg | ORAL_CAPSULE | Freq: Three times a day (TID) | ORAL | 0 refills | Status: DC | PRN

## 2023-06-25 NOTE — Progress Notes (Signed)
stre  Acute Office Visit  Subjective:     Patient ID: Erica Perez, female    DOB: [birth year], 52 y.o.   MRN: [id number]  Chief Complaint  Patient presents with   Nasal Congestion    Symptoms started last thursday. 2 negative covid tests   Cough   Diarrhea   Fever    HPI Erica Perez is a 52 year old female presents today on June 25, 2023 for an acute visit concerning for URI.  She reports that her symptoms started a few days ago and does not seems to get better. The patient is a 51 year old female presenting today for an acute visit with complaints of sore throat, ear pain, nasal congestion, and a productive cough for the past 5 days. Denies fever or chills. She has tested negative for flu with a point-of-care (POC) flu test, and she has also taken three at-home COVID tests, all of which were negative. The patient reports her sore throat has been persistent but is not associated with difficulty swallowing. The ear pain is described as mild, mostly affecting the right ear, and the nasal congestion has made breathing through her nose difficult. The cough is productive, with yellowish sputum, but she denies any hemoptysis. On examination, the throat is noted to be erythematous. Given the clinical findings, the patient will be treated conservatively for a likely upper respiratory infection (URI), and no immediate need for further testing is identified at this time. Active Ambulatory Problems    Diagnosis Date Noted   Attention deficit disorder 11/06/2012   Former smoker 12/04/2012   GAD (generalized anxiety disorder) 11/17/2022   Hyperlipidemia 07/13/2020   Insomnia 11/17/2022   Psoriasis 10/30/2019   Hormone replacement therapy (HRT) 11/21/2022   Acute sinusitis 06/25/2023   Nasal congestion 06/25/2023   Acute cough 06/25/2023   Pharyngitis 06/25/2023   Non-recurrent acute suppurative otitis media of left ear without spontaneous rupture of tympanic membrane 06/25/2023    Resolved Ambulatory Problems    Diagnosis Date Noted   No Resolved Ambulatory Problems   Past Medical History:  Diagnosis Date   add    Anxiety    Depression    GERD (gastroesophageal reflux disease)    Substance abuse (HCC)     ROS Negative unless indicated in HPI    Objective:    BP (!) 138/92   Pulse 93   Temp (!) 97.3 F (36.3 C) (Temporal)   Ht 5\' 4"  (1.626 m)   Wt 157 lb 3.2 oz (71.3 kg)   SpO2 97%   BMI 26.98 kg/m  BP Readings from Last 3 Encounters:  06/25/23 (!) 138/92  03/30/23 126/75  11/17/22 118/73   Wt Readings from Last 3 Encounters:  06/25/23 157 lb 3.2 oz (71.3 kg)  03/30/23 154 lb 6 oz (70 kg)  11/17/22 154 lb (69.9 kg)      Physical Exam Vitals and nursing note reviewed.  Constitutional:      Appearance: Normal appearance.  HENT:     Head: Normocephalic and atraumatic.     Right Ear: Swelling and tenderness present. Tympanic membrane is erythematous.     Left Ear: Hearing, tympanic membrane, ear canal and external ear normal.     Nose: Congestion and rhinorrhea present.     Right Turbinates: Swollen.     Left Turbinates: Swollen.     Right Sinus: No maxillary sinus tenderness or frontal sinus tenderness.     Left Sinus: No maxillary sinus tenderness or frontal sinus tenderness.  Mouth/Throat:     Pharynx: Posterior oropharyngeal erythema present.  Eyes:     General: No scleral icterus.    Extraocular Movements: Extraocular movements intact.     Conjunctiva/sclera: Conjunctivae normal.     Pupils: Pupils are equal, round, and reactive to light.  Cardiovascular:     Rate and Rhythm: Normal rate and regular rhythm.  Pulmonary:     Effort: Pulmonary effort is normal.     Breath sounds: Normal breath sounds.  Musculoskeletal:        General: Normal range of motion.     Right lower leg: No edema.     Left lower leg: No edema.  Skin:    General: Skin is warm and dry.     Findings: No rash.  Neurological:     Mental Status: She  is alert and oriented to person, place, and time. Mental status is at baseline.  Psychiatric:        Mood and Affect: Mood normal.        Behavior: Behavior normal.        Thought Content: Thought content normal.        Judgment: Judgment normal.     No results found for any visits on 06/25/23.      Assessment & Plan:  Acute cough -     Veritor Flu A/B Waived -     Benzonatate; Take 1 capsule (100 mg total) by mouth 3 (three) times daily as needed.  Dispense: 20 capsule; Refill: 0 -     POCT Rapid Strep A Dipstick Test  Pharyngitis, unspecified etiology -     Amoxicillin; Take 1 tablet (875 mg total) by mouth 2 (two) times daily.  Dispense: 14 tablet; Refill: 0 -     Rapid Strep Screen ([facility] ONLY)  Nasal congestion -     Veritor Flu A/B Waived -     Amoxicillin; Take 1 tablet (875 mg total) by mouth 2 (two) times daily.  Dispense: 14 tablet; Refill: 0  Acute sinusitis, recurrence not specified, unspecified location -     Amoxicillin; Take 1 tablet (875 mg total) by mouth 2 (two) times daily.  Dispense: 14 tablet; Refill: 0 -     Fluticasone Propionate; Place 2 sprays into both nostrils daily.  Dispense: 16 g; Refill: 6  Non-recurrent acute suppurative otitis media of left ear without spontaneous rupture of tympanic membrane -     Amoxicillin; Take 1 tablet (875 mg total) by mouth 2 (two) times daily.  Dispense: 14 tablet; Refill: 65   Matti 52 year old Caucasian female, no acute distress Sinusitis: Flonase nasal spray 1 spray on each nostril daily Otitis media: Amoxicillin 875 twice daily for 7 days Pharyngitis: Will treat based on clinical findings amoxicillin 875 1 tab twice daily for 7 days Cough: Tessalon Perles 1 tablet twice daily as needed take with at least 8 ounces of water Tylenol/ibuprofen for fever Return if symptoms worsen or fail to improve.  Arrie Aran Santa Lighter, DNP [facility] [address] [place], [place]  [address] [phone]

## 2023-07-03 ENCOUNTER — Ambulatory Visit: Payer: Self-pay | Admitting: Family Medicine

## 2023-07-03 NOTE — Telephone Encounter (Signed)
Copied from CRM [id number]. Topic: Clinical - Red Word Triage >> Jul 03, 2023  8:03 AM Dondra Prader A wrote: Red Word that prompted transfer to Nurse Triage: Pt is still having symptoms after completing antibiotics. She has a pain in upper left side of back when breathing that started yesterday. Coughing/wheezing and congestion since last week.  Chief Complaint: productive cough not improved after taking antibiotic Symptoms: wheezing, shortness of breath with exertion, nose bleed Frequency: ongoing since 06/22/23 Pertinent Negatives: Patient denies coughing up blood and fever higher than 100 F Disposition: [] ED /[x] Urgent Care (no appt availability in office) / [] Appointment(In office/virtual)/ []  [facility]/ [] Home Care/ [] Refused Recommended Disposition /[]  Mobile Bus/ []  Follow-up with PCP Additional Notes: The patient reported that she has been sick since 06/22/23.  She stated that she went to the doctor 06/25/23 and was diagnosed with stress and ear infections and was prescribed an antibiotic.  She reported completing the antibiotic but her symptoms remain the same.  She has a work trip Friday that she cannot miss and appointment availability in the office was not until Thursday.  Referred the caller to urgent care for evaluation.  The caller disconnected the call prior to care advise being given.    Reason for Disposition  [1] Nasal discharge AND [2] present > 10 days  Answer Assessment - Initial Assessment Questions 1. ONSET: "When did the cough begin?"      06/22/23  2. SPUTUM: "Describe the color of your sputum" (none, dry cough; clear, white, yellow, green)     Green/clear 4. HEMOPTYSIS: "Are you coughing up any blood?" If so ask: "How much?" (flecks, streaks, tablespoons, etc.)     No 5. DIFFICULTY BREATHING: "Are you having difficulty breathing?" If Yes, ask: "How bad is it?" (e.g., mild, moderate, severe)    - MILD: No SOB at rest, mild SOB with walking, speaks  normally in sentences, can lie down, no retractions, pulse < 100.    - MODERATE: SOB at rest, SOB with minimal exertion and prefers to sit, cannot lie down flat, speaks in phrases, mild retractions, audible wheezing, pulse 100-120.    - SEVERE: Very SOB at rest, speaks in single words, struggling to breathe, sitting hunched forward, retractions, pulse > 120      Shortness of breath with exertion  6. FEVER: "Do you have a fever?" If Yes, ask: "What is your temperature, how was it measured, and when did it start?"     2 days ago 99.9-100 7. OTHER SYMPTOMS: "Do you have any other symptoms?" (e.g., runny nose, wheezing, chest pain)       Wheezing, upper left back pain with breaths, nose bleed Finished course of antibiotics and symptoms remain  Protocols used: Cough - Acute Productive-A-AH

## 2023-07-04 DIAGNOSIS — R112 Nausea with vomiting, unspecified: Secondary | ICD-10-CM | POA: Diagnosis not present

## 2023-07-04 DIAGNOSIS — R519 Headache, unspecified: Secondary | ICD-10-CM | POA: Diagnosis not present

## 2023-07-04 DIAGNOSIS — Z20822 Contact with and (suspected) exposure to covid-19: Secondary | ICD-10-CM | POA: Diagnosis not present

## 2023-07-20 DIAGNOSIS — F411 Generalized anxiety disorder: Secondary | ICD-10-CM | POA: Diagnosis not present

## 2023-07-20 DIAGNOSIS — F902 Attention-deficit hyperactivity disorder, combined type: Secondary | ICD-10-CM | POA: Diagnosis not present

## 2023-09-06 ENCOUNTER — Encounter: Admit: 2023-09-06 | Discharge: 2023-09-06 | Payer: BC Managed Care – PPO

## 2023-10-05 DIAGNOSIS — F411 Generalized anxiety disorder: Secondary | ICD-10-CM | POA: Diagnosis not present

## 2023-10-05 DIAGNOSIS — F902 Attention-deficit hyperactivity disorder, combined type: Secondary | ICD-10-CM | POA: Diagnosis not present

## 2023-10-10 DIAGNOSIS — R634 Abnormal weight loss: Secondary | ICD-10-CM | POA: Diagnosis not present

## 2023-10-10 DIAGNOSIS — Z87891 Personal history of nicotine dependence: Secondary | ICD-10-CM | POA: Diagnosis not present

## 2023-10-10 DIAGNOSIS — R109 Unspecified abdominal pain: Secondary | ICD-10-CM | POA: Diagnosis not present

## 2023-10-10 DIAGNOSIS — K828 Other specified diseases of gallbladder: Secondary | ICD-10-CM | POA: Diagnosis not present

## 2023-10-10 DIAGNOSIS — R1011 Right upper quadrant pain: Secondary | ICD-10-CM | POA: Diagnosis not present

## 2023-10-10 DIAGNOSIS — R1013 Epigastric pain: Secondary | ICD-10-CM | POA: Diagnosis not present

## 2023-10-10 DIAGNOSIS — T50905A Adverse effect of unspecified drugs, medicaments and biological substances, initial encounter: Secondary | ICD-10-CM | POA: Diagnosis not present

## 2023-10-10 DIAGNOSIS — K449 Diaphragmatic hernia without obstruction or gangrene: Secondary | ICD-10-CM | POA: Diagnosis not present

## 2023-10-10 DIAGNOSIS — K5730 Diverticulosis of large intestine without perforation or abscess without bleeding: Secondary | ICD-10-CM | POA: Diagnosis not present

## 2023-10-10 DIAGNOSIS — R112 Nausea with vomiting, unspecified: Secondary | ICD-10-CM | POA: Diagnosis not present

## 2023-10-10 DIAGNOSIS — R0789 Other chest pain: Secondary | ICD-10-CM | POA: Diagnosis not present

## 2023-10-11 DIAGNOSIS — R1013 Epigastric pain: Secondary | ICD-10-CM | POA: Diagnosis not present

## 2023-10-11 DIAGNOSIS — K5730 Diverticulosis of large intestine without perforation or abscess without bleeding: Secondary | ICD-10-CM | POA: Diagnosis not present

## 2023-10-11 DIAGNOSIS — R634 Abnormal weight loss: Secondary | ICD-10-CM | POA: Diagnosis not present

## 2023-10-11 DIAGNOSIS — R1011 Right upper quadrant pain: Secondary | ICD-10-CM | POA: Diagnosis not present

## 2023-10-11 DIAGNOSIS — R112 Nausea with vomiting, unspecified: Secondary | ICD-10-CM | POA: Diagnosis not present

## 2023-10-11 DIAGNOSIS — K449 Diaphragmatic hernia without obstruction or gangrene: Secondary | ICD-10-CM | POA: Diagnosis not present

## 2023-10-11 DIAGNOSIS — K828 Other specified diseases of gallbladder: Secondary | ICD-10-CM | POA: Diagnosis not present

## 2023-10-29 DIAGNOSIS — F4320 Adjustment disorder, unspecified: Secondary | ICD-10-CM | POA: Diagnosis not present

## 2023-11-12 DIAGNOSIS — F4320 Adjustment disorder, unspecified: Secondary | ICD-10-CM | POA: Diagnosis not present

## 2023-12-03 DIAGNOSIS — F4320 Adjustment disorder, unspecified: Secondary | ICD-10-CM | POA: Diagnosis not present

## 2023-12-11 DIAGNOSIS — F4320 Adjustment disorder, unspecified: Secondary | ICD-10-CM | POA: Diagnosis not present

## 2023-12-28 DIAGNOSIS — F902 Attention-deficit hyperactivity disorder, combined type: Secondary | ICD-10-CM | POA: Diagnosis not present

## 2023-12-28 DIAGNOSIS — F411 Generalized anxiety disorder: Secondary | ICD-10-CM | POA: Diagnosis not present

## 2024-01-01 DIAGNOSIS — F4322 Adjustment disorder with anxiety: Secondary | ICD-10-CM | POA: Diagnosis not present

## 2024-03-02 IMAGING — MG MM DIGITAL SCREENING BILAT W/ TOMO AND CAD
8 series · 9 of 24 positions shown · non-contrast
Comparison: None Available.

CLINICAL DATA: Screening.

EXAM:
DIGITAL SCREENING BILATERAL MAMMOGRAM WITH TOMOSYNTHESIS AND CAD
TECHNIQUE: Bilateral screening digital craniocaudal and mediolateral oblique
mammograms were obtained. Bilateral screening digital breast
tomosynthesis was performed. The images were evaluated with
computer-aided detection.

[R MLO synth-2D]
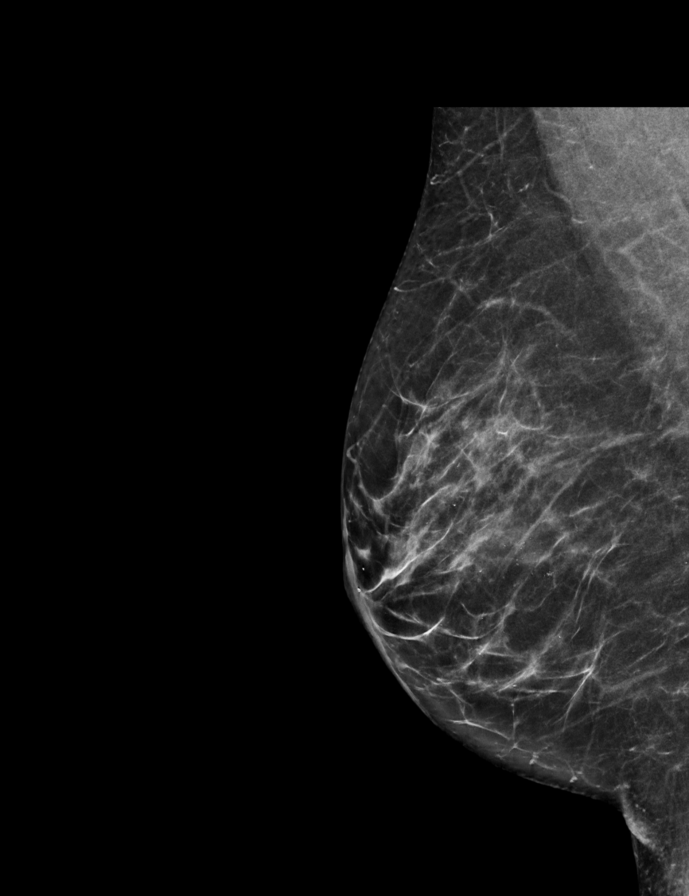

[L CC synth-2D]
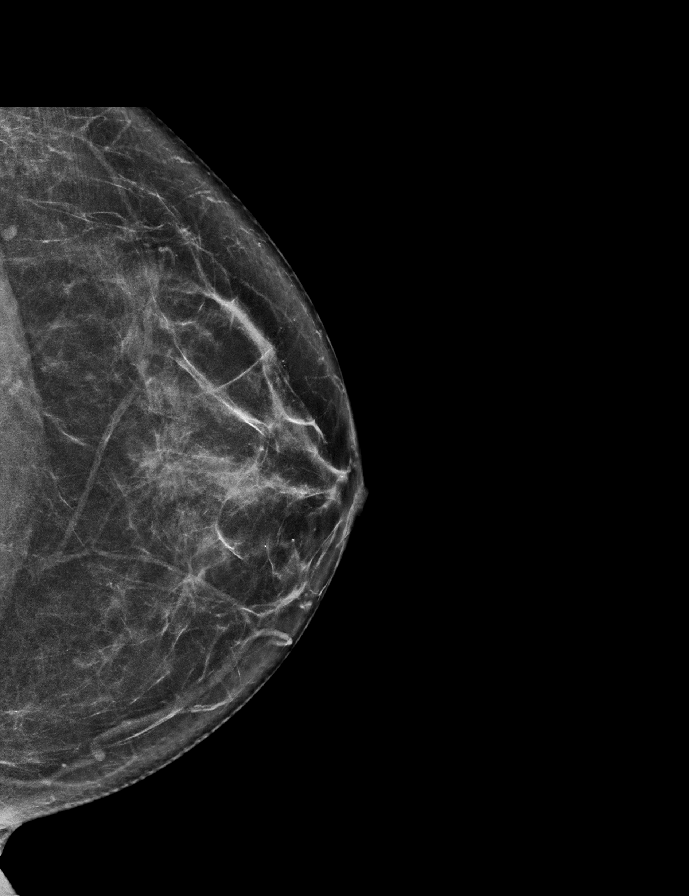

[L MLO synth-2D]
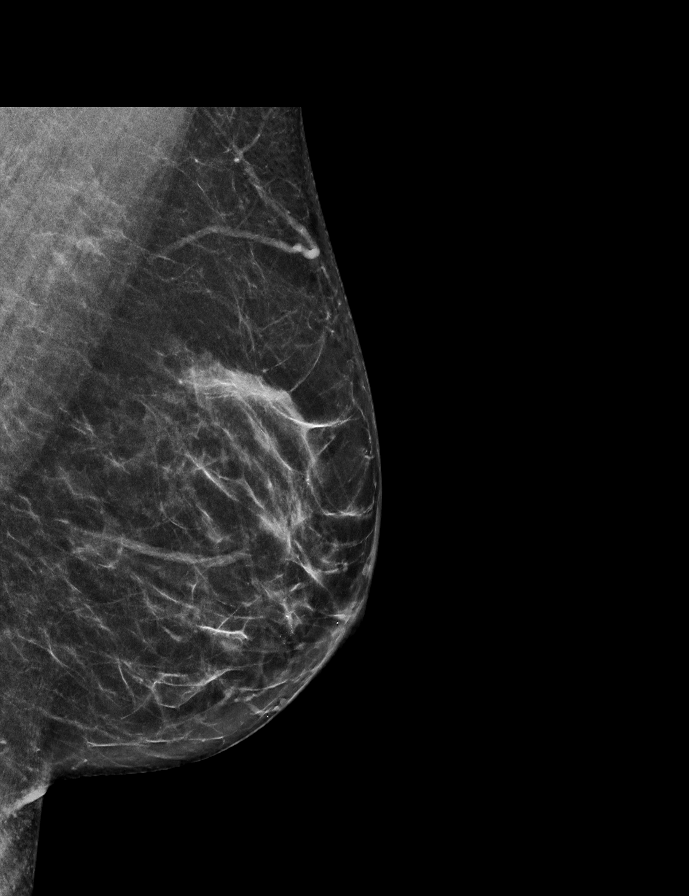

[R CC synth-2D]
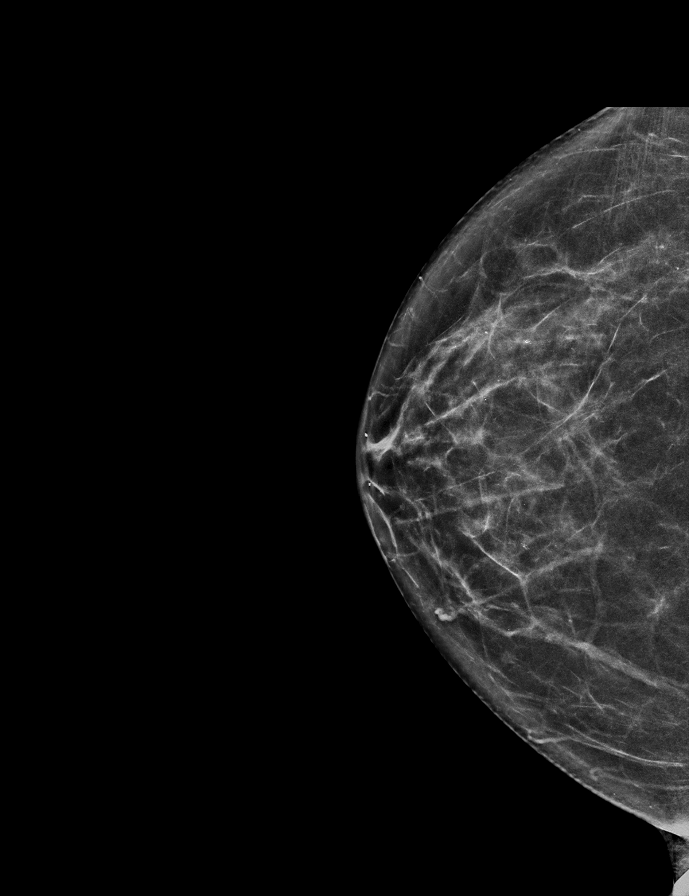

[L CC tomo · 2 of 65 frames shown]
[frame 21/65]
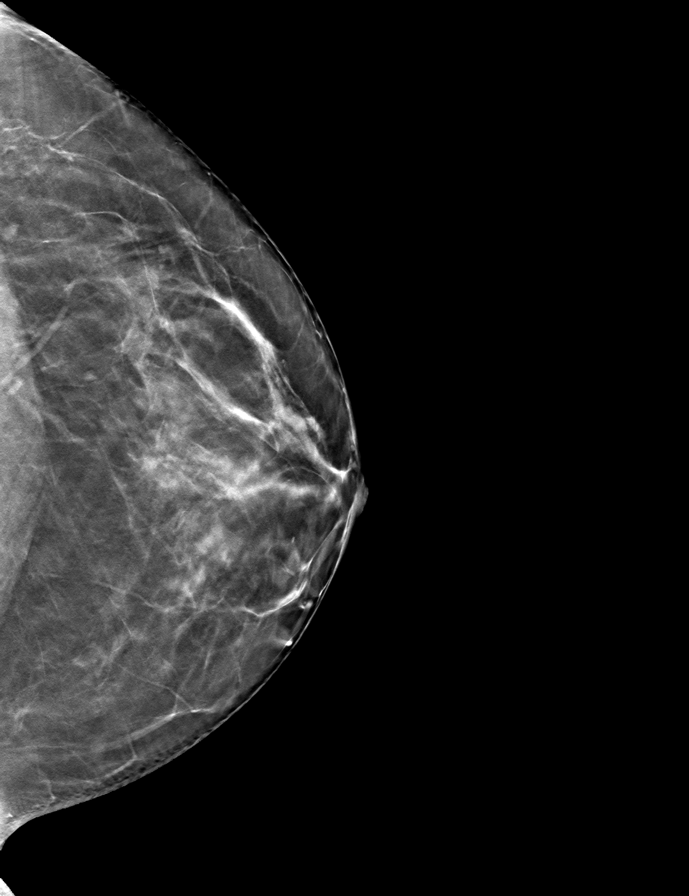
[frame 33/65]
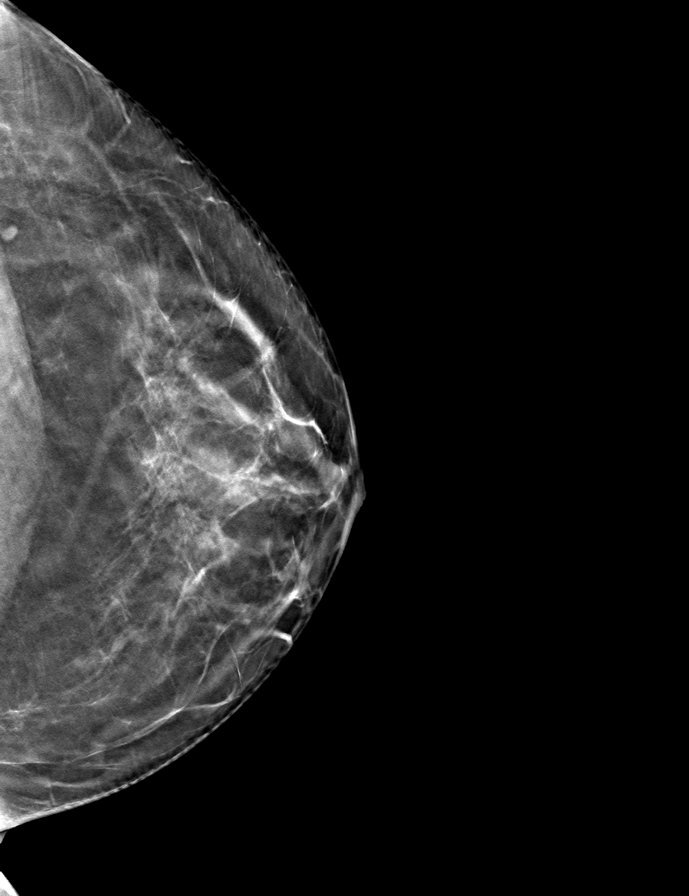

[L MLO tomo · tomo slice 32/63.0]
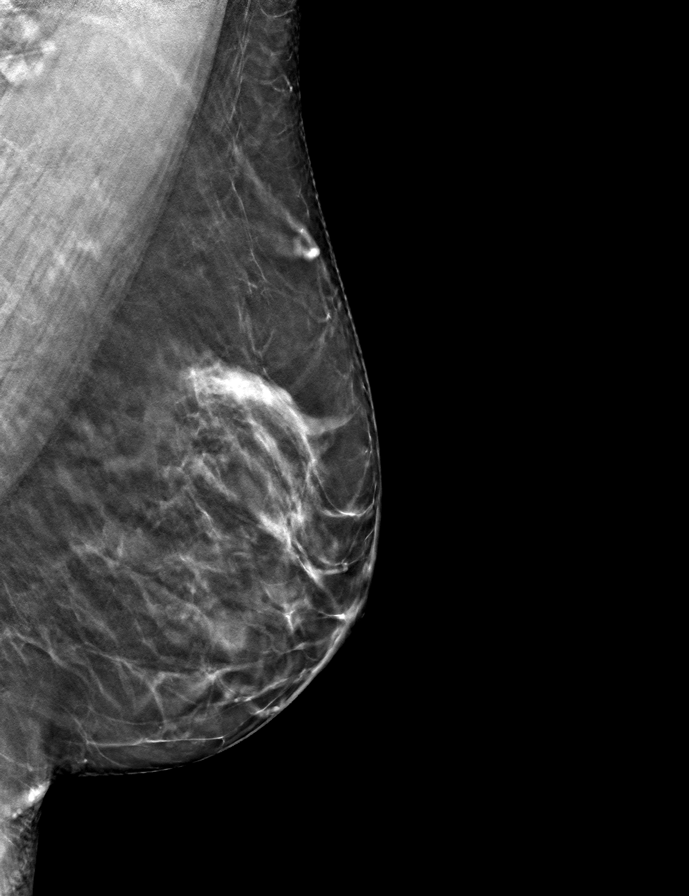

[R MLO tomo · tomo slice 32/63.0]
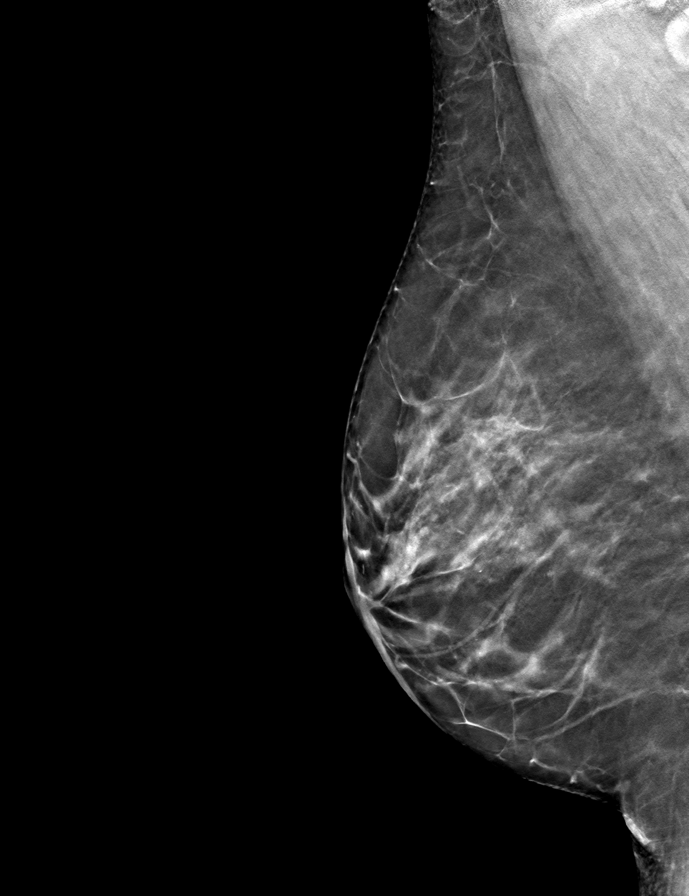

[R CC tomo · tomo slice 32/63.0]
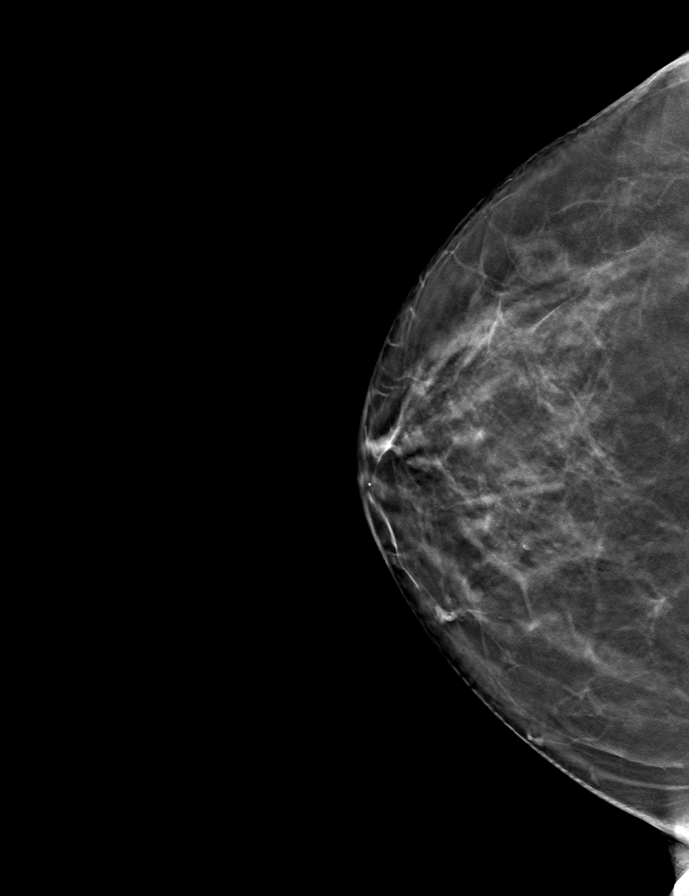

[9 of 24 positions shown; findings below may reference images not displayed]

ACR Breast Density Category b: There are scattered areas of
fibroglandular density.
FINDINGS: In the right breast, calcifications warrant further evaluation with
magnified views. In the left breast, no findings suspicious for
malignancy.
IMPRESSION: Further evaluation is suggested for calcifications in the right
breast.

RECOMMENDATION:
Diagnostic mammogram of the right breast. (Code:7P-2-XX5)

The patient will be contacted regarding the findings, and additional
imaging will be scheduled.

BI-RADS CATEGORY  0: Incomplete. Need additional imaging evaluation
and/or prior mammograms for comparison.

## 2024-03-24 DIAGNOSIS — F411 Generalized anxiety disorder: Secondary | ICD-10-CM | POA: Diagnosis not present

## 2024-03-24 DIAGNOSIS — F902 Attention-deficit hyperactivity disorder, combined type: Secondary | ICD-10-CM | POA: Diagnosis not present

## 2024-04-02 ENCOUNTER — Encounter: Payer: BC Managed Care – PPO | Admitting: Family Medicine

## 2024-04-03 ENCOUNTER — Encounter: Payer: Self-pay | Admitting: Family Medicine

## 2024-04-04 ENCOUNTER — Ambulatory Visit: Admitting: Family Medicine

## 2024-04-04 VITALS — BP 127/90 | HR 91 | Temp 97.4°F | Ht 64.0 in | Wt 162.6 lb

## 2024-04-04 DIAGNOSIS — R5383 Other fatigue: Secondary | ICD-10-CM

## 2024-04-04 DIAGNOSIS — Z79890 Hormone replacement therapy (postmenopausal): Secondary | ICD-10-CM | POA: Diagnosis not present

## 2024-04-04 DIAGNOSIS — E7841 Elevated Lipoprotein(a): Secondary | ICD-10-CM | POA: Diagnosis not present

## 2024-04-04 DIAGNOSIS — Z0000 Encounter for general adult medical examination without abnormal findings: Secondary | ICD-10-CM

## 2024-04-04 DIAGNOSIS — Z0001 Encounter for general adult medical examination with abnormal findings: Secondary | ICD-10-CM

## 2024-04-04 NOTE — Progress Notes (Signed)
 Complete physical exam  Patient: Erica Perez   DOB: Aug 08, 1970   53 y.o. Female  MRN: [id number]  Subjective:    Chief Complaint  Patient presents with   Annual Exam    Erica Perez is a 53 y.o. female who presents today for a complete physical exam. She reports consuming a well balanced diet. The patient does not participate in regular exercise at present. She generally feels well. She reports sleeping well. She does have additional problems to discuss today.   Discussed the use of AI scribe software for clinical note transcription with the patient, who gave verbal consent to proceed.  History of Present Illness   Erica Perez is a 53 year old female who presents for a physical exam and to discuss hormone-related symptoms.  Vasomotor and menopausal symptoms - Significant night sweats and hot flashes - Itchy skin onset one week ago, requiring Benadryl  for relief - Hair loss present - Menopausal symptoms began in 2021 following hysterectomy in 2013 - Currently taking 2 mg estrogen and DHEA; initial improvement in night sweats, but symptoms have recurred  Neuropsychiatric symptoms - Obsessive-compulsive disorder and attention-deficit/hyperactivity disorder managed with intermittent antidepressants for decades - Currently taking an antidepressant with benefit for OCD symptoms - Brain fog and episodes of inactivity - Managed by Behavioral health  Sleep disturbance - Sleep improved with magnesium, calcium, and vitamin D  supplementation - Reduced need for clonidine and melatonin - Supplements also provide stress reduction - Still feels quite fatigued during the day however  Hyperlipidemia - Family history of hypercholesterolemia in father and brother - Personal history of elevated cholesterol despite lifestyle modifications - Interest in current cholesterol levels         06/25/2023    2:29 PM 03/30/2023    9:13 AM 11/17/2022    4:22 PM  Depression screen PHQ  2/9  Decreased Interest 0 1 0  Down, Depressed, Hopeless 0 1 0  PHQ - 2 Score 0 2 0  Altered sleeping 0 1 0  Tired, decreased energy 0 1 2  Change in appetite 0 1 2  Feeling bad or failure about yourself  0 1 0  Trouble concentrating 0 2 2  Moving slowly or fidgety/restless 0 0 0  Suicidal thoughts 0 0 0  PHQ-9 Score 0 8 6  Difficult doing work/chores Not difficult at all Not difficult at all Not difficult at all      06/25/2023    2:29 PM 03/30/2023    9:12 AM 11/17/2022    4:23 PM  GAD 7 : Generalized Anxiety Score  Nervous, Anxious, on Edge 0 1 1  Control/stop worrying 1 2 1   Worry too much - different things 1 2 1   Trouble relaxing 1 3 1   Restless 0 0 1  Easily annoyed or irritable 1 1 0  Afraid - awful might happen 0 1 0  Total GAD 7 Score 4 10 5   Anxiety Difficulty Not difficult at all Somewhat difficult Not difficult at all     Most recent fall risk assessment:    06/25/2023    2:29 PM  Fall Risk   Falls in the past year? 0  Number falls in past yr: 0  Injury with Fall? 0  Risk for fall due to : No Fall Risks  Follow up Falls evaluation completed     Most recent depression screenings:    06/25/2023    2:29 PM 03/30/2023    9:13 AM  PHQ  2/9 Scores  PHQ - 2 Score 0 2  PHQ- 9 Score 0 8    Vision:Within last year and Dental: No current dental problems and Receives regular dental care  Past Medical History:  Diagnosis Date   add    Anxiety    Depression    GERD (gastroesophageal reflux disease)    Hyperlipidemia    Substance abuse (HCC)    benzodiazepines-none since 10/2009      Patient Care Team: Joesph Annabella HERO, FNP as PCP - General (Family Medicine)   Outpatient Medications Prior to Visit  Medication Sig   escitalopram (LEXAPRO) 10 MG tablet Take 10 mg by mouth daily.   estradiol (ESTRACE) 0.5 MG tablet Take 2 mg by mouth daily.   fluticasone  (FLONASE ) 50 MCG/ACT nasal spray Place 2 sprays into both nostrils daily.   lisdexamfetamine  (VYVANSE) 70 MG capsule Take 70 mg by mouth daily.   amoxicillin  (AMOXIL ) 875 MG tablet Take 1 tablet (875 mg total) by mouth 2 (two) times daily.   benzonatate  (TESSALON  PERLES) 100 MG capsule Take 1 capsule (100 mg total) by mouth 3 (three) times daily as needed.   cloNIDine HCl (KAPVAY) 0.1 MG TB12 ER tablet Take 0.1 mg by mouth daily as needed.   Emollient (DHEA EX) Apply topically.   No facility-administered medications prior to visit.    ROS Negative unless specially indicated above in HPI.     Objective:     BP (!) 127/90   Pulse 91   Temp (!) 97.4 F (36.3 C) (Temporal)   Ht 5' 4 (1.626 m)   Wt 162 lb 9.6 oz (73.8 kg)   SpO2 99%   BMI 27.91 kg/m  Wt Readings from Last 3 Encounters:  04/04/24 162 lb 9.6 oz (73.8 kg)  06/25/23 157 lb 3.2 oz (71.3 kg)  03/30/23 154 lb 6 oz (70 kg)      Physical Exam Vitals and nursing note reviewed.  Constitutional:      General: She is not in acute distress.    Appearance: She is not ill-appearing, toxic-appearing or diaphoretic.  HENT:     Head: Normocephalic.     Right Ear: Tympanic membrane, ear canal and external ear normal.     Left Ear: Tympanic membrane, ear canal and external ear normal.     Nose: Nose normal.     Mouth/Throat:     Mouth: Mucous membranes are moist.     Pharynx: Oropharynx is clear.  Eyes:     Extraocular Movements: Extraocular movements intact.     Conjunctiva/sclera: Conjunctivae normal.     Pupils: Pupils are equal, round, and reactive to light.  Cardiovascular:     Rate and Rhythm: Normal rate and regular rhythm.     Pulses: Normal pulses.     Heart sounds: Normal heart sounds. No murmur heard.    No friction rub. No gallop.  Pulmonary:     Effort: Pulmonary effort is normal.     Breath sounds: Normal breath sounds.  Abdominal:     General: Bowel sounds are normal. There is no distension.     Palpations: Abdomen is soft. There is no mass.     Tenderness: There is no abdominal tenderness.  There is no guarding.  Musculoskeletal:        General: No tenderness.     Cervical back: Normal range of motion and neck supple. No tenderness.     Right lower leg: No edema.     Left lower  leg: No edema.  Skin:    General: Skin is warm and dry.     Capillary Refill: Capillary refill takes less than 2 seconds.     Findings: No lesion or rash.  Neurological:     General: No focal deficit present.     Mental Status: She is alert and oriented to person, place, and time.     Cranial Nerves: No cranial nerve deficit.     Motor: No weakness.     Gait: Gait normal.  Psychiatric:        Mood and Affect: Mood normal.        Behavior: Behavior normal.        Thought Content: Thought content normal.        Judgment: Judgment normal.      No results found for any visits on 04/04/24.     Assessment & Plan:    Routine Health Maintenance and Physical Exam  Kiaria was seen today for annual exam.  Diagnoses and all orders for this visit:  Routine general medical examination at a health care facility  Hormone replacement therapy (HRT) -     Ambulatory referral to Gynecology  Other fatigue -     CBC with Differential/Platelet -     CMP14+EGFR -     TSH -     Vitamin B12 -     Vitamin D , 25-hydroxy  Elevated lipoprotein(a) -     Lipid panel     Menopausal symptoms Menopausal symptoms persist despite current hormone therapy, affecting quality of life. Seeking local hormone replacement therapy due to cost concerns. - Refer to GYN in [facility] for hormone replacement therapy management. - Advise Zyrtec for persistent itchy skin.  Hyperlipidemia Genetic hyperlipidemia with previous elevated cholesterol levels. Evaluating medication impact on cholesterol. - Order lab work to assess cholesterol levels.  Fatigue - Will check labs as above - Could be related to menopause, depression, stressful lifestyle  Immunization History  Administered Date(s) Administered   Influenza,inj,Quad  PF,6+ Mos 06/10/2014, 07/13/2020   Influenza-Unspecified 04/03/2024   PFIZER(Purple Top)SARS-COV-2 Vaccination 11/14/2019, 12/12/2019   PNEUMOCOCCAL CONJUGATE-20 07/30/2023   Tdap 01/29/2014   Zoster Recombinant(Shingrix) 07/30/2023    Health Maintenance  Topic Date Due   DTaP/Tdap/Td (2 - Td or Tdap) 01/30/2024   Zoster Vaccines- Shingrix (2 of 2) 07/04/2024 (Originally 09/24/2023)   Lung Cancer Screening  04/04/2025 (Originally 08/22/2022)   Hepatitis B Vaccines 19-59 Average Risk (1 of 3 - 19+ 3-dose series) 04/04/2025 (Originally 08/23/1991)   Colonoscopy  04/04/2025 (Originally 08/22/2017)   COVID-19 Vaccine (3 - 2025-26 season) 04/20/2025 (Originally 03/31/2024)   MAMMOGRAM  04/12/2025   Pneumococcal Vaccine: 50+ Years  Completed   Influenza Vaccine  Completed   Hepatitis C Screening  Completed   HIV Screening  Completed   HPV VACCINES  Aged Out   Meningococcal B Vaccine  Aged Out    Discussed health benefits of physical activity, and encouraged her to engage in regular exercise appropriate for her age and condition.  Problem List Items Addressed This Visit       Other   Hyperlipidemia   Relevant Orders   Lipid panel   Hormone replacement therapy (HRT)   Relevant Orders   Ambulatory referral to Gynecology   Other Visit Diagnoses       Routine general medical examination at a health care facility    -  Primary     Other fatigue       Relevant Orders  CBC with Differential/Platelet   CMP14+EGFR   TSH   Vitamin B12   Vitamin D , 25-hydroxy      Return in 1 year (on 04/04/2025).   The patient indicates understanding of these issues and agrees with the plan.  Annabella CHRISTELLA Search, FNP

## 2024-04-04 NOTE — Patient Instructions (Signed)

## 2024-04-05 LAB — TSH: TSH: 1.75 u[IU]/mL (ref 0.450–4.500)

## 2024-04-05 LAB — CBC WITH DIFFERENTIAL/PLATELET
Basophils Absolute: 0.1 x10E3/uL (ref 0.0–0.2)
Basos: 1 %
EOS (ABSOLUTE): 0.2 x10E3/uL (ref 0.0–0.4)
Eos: 3 %
Hematocrit: 45.4 % (ref 34.0–46.6)
Hemoglobin: 14.6 g/dL (ref 11.1–15.9)
Immature Grans (Abs): 0 x10E3/uL (ref 0.0–0.1)
Immature Granulocytes: 0 %
Lymphocytes Absolute: 2 x10E3/uL (ref 0.7–3.1)
Lymphs: 33 %
MCH: 29.1 pg (ref 26.6–33.0)
MCHC: 32.2 g/dL (ref 31.5–35.7)
MCV: 90 fL (ref 79–97)
Monocytes Absolute: 0.6 x10E3/uL (ref 0.1–0.9)
Monocytes: 9 %
Neutrophils Absolute: 3.2 x10E3/uL (ref 1.4–7.0)
Neutrophils: 54 %
Platelets: 469 x10E3/uL — ABNORMAL HIGH (ref 150–450)
RBC: 5.02 x10E6/uL (ref 3.77–5.28)
RDW: 12.4 % (ref 11.7–15.4)
WBC: 6.1 x10E3/uL (ref 3.4–10.8)

## 2024-04-05 LAB — CMP14+EGFR
ALT: 12 IU/L (ref 0–32)
AST: 16 IU/L (ref 0–40)
Albumin: 4.6 g/dL (ref 3.8–4.9)
Alkaline Phosphatase: 59 IU/L (ref 44–121)
BUN/Creatinine Ratio: 18 (ref 9–23)
BUN: 14 mg/dL (ref 6–24)
Bilirubin Total: 0.2 mg/dL (ref 0.0–1.2)
CO2: 21 mmol/L (ref 20–29)
Calcium: 9.9 mg/dL (ref 8.7–10.2)
Chloride: 103 mmol/L (ref 96–106)
Creatinine, Ser: 0.77 mg/dL (ref 0.57–1.00)
Globulin, Total: 2.5 g/dL (ref 1.5–4.5)
Glucose: 92 mg/dL (ref 70–99)
Potassium: 5.4 mmol/L — ABNORMAL HIGH (ref 3.5–5.2)
Sodium: 138 mmol/L (ref 134–144)
Total Protein: 7.1 g/dL (ref 6.0–8.5)
eGFR: 93 mL/min/1.73 (ref >59)

## 2024-04-05 LAB — VITAMIN D 25 HYDROXY (VIT D DEFICIENCY, FRACTURES): Vit D, 25-Hydroxy: 48 ng/mL (ref 30.0–100.0)

## 2024-04-05 LAB — LIPID PANEL
Chol/HDL Ratio: 4.3 ratio (ref 0.0–4.4)
Cholesterol, Total: 269 mg/dL — ABNORMAL HIGH (ref 100–199)
HDL: 62 mg/dL (ref >39)
LDL Chol Calc (NIH): 173 mg/dL — ABNORMAL HIGH (ref 0–99)
Triglycerides: 186 mg/dL — ABNORMAL HIGH (ref 0–149)
VLDL Cholesterol Cal: 34 mg/dL (ref 5–40)

## 2024-04-05 LAB — VITAMIN B12: Vitamin B-12: 1295 pg/mL — ABNORMAL HIGH (ref 232–1245)

## 2024-04-07 ENCOUNTER — Ambulatory Visit: Payer: Self-pay | Admitting: Family Medicine

## 2024-04-07 ENCOUNTER — Telehealth: Payer: Self-pay | Admitting: Family Medicine

## 2024-04-07 DIAGNOSIS — E875 Hyperkalemia: Secondary | ICD-10-CM

## 2024-04-07 NOTE — Telephone Encounter (Signed)
 NOTED

## 2024-04-07 NOTE — Telephone Encounter (Signed)
 Copied from CRM #[id number]. Topic: Clinical - Lab/Test Results >> Apr 07, 2024 12:51 PM Montie POUR wrote: Reason for CRM:  I read Erica Perez her lab results from 04/07/24. She had no questions. She will decrease B12 supplements. She will check her work schedule and call back to schedule her repeat potassium.

## 2024-04-18 ENCOUNTER — Other Ambulatory Visit

## 2024-04-18 DIAGNOSIS — E875 Hyperkalemia: Secondary | ICD-10-CM | POA: Diagnosis not present

## 2024-04-19 LAB — POTASSIUM: Potassium: 5.3 mmol/L — ABNORMAL HIGH (ref 3.5–5.2)

## 2024-04-21 ENCOUNTER — Ambulatory Visit: Payer: Self-pay | Admitting: Family Medicine

## 2024-04-21 DIAGNOSIS — E875 Hyperkalemia: Secondary | ICD-10-CM

## 2024-05-09 ENCOUNTER — Other Ambulatory Visit

## 2024-05-13 ENCOUNTER — Other Ambulatory Visit

## 2024-06-10 DIAGNOSIS — F902 Attention-deficit hyperactivity disorder, combined type: Secondary | ICD-10-CM | POA: Diagnosis not present

## 2024-06-10 DIAGNOSIS — F411 Generalized anxiety disorder: Secondary | ICD-10-CM | POA: Diagnosis not present

## 2024-06-20 ENCOUNTER — Ambulatory Visit: Admitting: Family Medicine

## 2024-06-20 ENCOUNTER — Encounter: Payer: Self-pay | Admitting: Family Medicine

## 2024-06-20 VITALS — BP 128/84 | Ht 64.0 in | Wt 156.6 lb

## 2024-06-20 DIAGNOSIS — R197 Diarrhea, unspecified: Secondary | ICD-10-CM

## 2024-06-20 DIAGNOSIS — M542 Cervicalgia: Secondary | ICD-10-CM

## 2024-06-20 DIAGNOSIS — R1084 Generalized abdominal pain: Secondary | ICD-10-CM

## 2024-06-20 DIAGNOSIS — E876 Hypokalemia: Secondary | ICD-10-CM | POA: Diagnosis not present

## 2024-06-20 DIAGNOSIS — R112 Nausea with vomiting, unspecified: Secondary | ICD-10-CM

## 2024-06-20 MED ORDER — PROMETHAZINE HCL 25 MG PO TABS: 12.5000 mg | ORAL_TABLET | Freq: Three times a day (TID) | ORAL | 0 refills | Status: AC | PRN

## 2024-06-20 MED ORDER — CYCLOBENZAPRINE HCL 10 MG PO TABS: 10.0000 mg | ORAL_TABLET | Freq: Three times a day (TID) | ORAL | 0 refills | Status: AC | PRN

## 2024-06-20 NOTE — Progress Notes (Signed)
 Established Patient Office Visit  Subjective   Patient ID: Erica Perez, female    DOB: 27-Nov-1970  Age: 53 y.o. MRN: [id number]  Chief Complaint  Patient presents with   Hospitalization Follow-up    HPI  History of Present Illness   Erica Perez is a 53 year old female who presents with recurrent abdominal pain, nausea, and vomiting.  Hospitalization and laboratory findings - Experienced severe, excruciating epigastric pain, described as worse than labor that started on 06/06/24 - Associated with nausea, vomiting, and diarrhea - Admitted to the hospital on November 7th, discharged on November 8th, and readmitted on November 10th due to persistent symptoms - Elevated lipase levels during hospitalization - Elevated white blood cell count and low potassium levels noted - Treated with intravenous fluids, potassium repletion, and multiple antiemetic medications  - CT was not consistent with pancreatitis - Presumed infectious etiology - She had negative c. diff and GI PCR testing  Abdominal pain and gastrointestinal symptoms - Intermittent episodes of nausea and vomiting persist, with a recent episode two days ago after consuming a protein shake and coffee. - During these episodes zofran  does not help - No nausea today.  - continues to have intermittent diarrhea - continues to have mild generalized abdominal pain  Dietary and substance exposure - Recent aversion to meat  - History of tick bites - No recent cannabis use during travels - No significant alcohol intake prior to symptoms  Migraine family history - Family history of migraines affecting daughter and mother - No personal history of migraines  She is having some neck pain since hospitalization. Feels like muscle tension. Requesting muscle relaxer.    Requesting referral to GI for further evaluation.        ROS As per HPI.    Objective:     BP 128/84   Ht 5' 4 (1.626 m)   Wt 156 lb 9.6 oz (71  kg)   BMI 26.88 kg/m  BP Readings from Last 3 Encounters:  04/04/24 (!) 127/90  06/25/23 (!) 138/92  03/30/23 126/75   Wt Readings from Last 3 Encounters:  06/20/24 156 lb 9.6 oz (71 kg)  04/04/24 162 lb 9.6 oz (73.8 kg)  06/25/23 157 lb 3.2 oz (71.3 kg)     Physical Exam Vitals and nursing note reviewed.  Constitutional:      General: She is not in acute distress.    Appearance: She is not ill-appearing, toxic-appearing or diaphoretic.  Eyes:     General: No scleral icterus. Cardiovascular:     Rate and Rhythm: Normal rate and regular rhythm.     Heart sounds: Normal heart sounds. No murmur heard. Pulmonary:     Effort: Pulmonary effort is normal. No respiratory distress.     Breath sounds: Normal breath sounds. No wheezing, rhonchi or rales.  Abdominal:     General: Bowel sounds are normal. There is no distension.     Palpations: Abdomen is soft.     Tenderness: There is no abdominal tenderness. There is no guarding or rebound.  Musculoskeletal:     Right lower leg: No edema.     Left lower leg: No edema.  Skin:    General: Skin is warm and dry.     Coloration: Skin is not jaundiced.  Neurological:     General: No focal deficit present.     Mental Status: She is alert and oriented to person, place, and time.  Psychiatric:  Mood and Affect: Mood normal.        Behavior: Behavior normal.      No results found for any visits on 06/20/24.    The 10-year ASCVD risk score (Arnett DK, et al., 2019) is: 1%    Assessment & Plan:   Erica Perez was seen today for hospitalization follow-up.  Diagnoses and all orders for this visit:  Intractable nausea and vomiting -     promethazine  (PHENERGAN ) 25 MG tablet; Take 0.5-1 tablets (12.5-25 mg total) by mouth every 8 (eight) hours as needed for refractory nausea / vomiting. -     Alpha-Gal Panel -     CBC with Differential/Platelet -     Lipase -     Ambulatory referral to Gastroenterology  Generalized abdominal  pain -     Alpha-Gal Panel -     CBC with Differential/Platelet -     Lipase -     Ambulatory referral to Gastroenterology  Acute diarrhea -     Ambulatory referral to Gastroenterology  Hypokalemia -     CMP14+EGFR  Acute neck pain   Assessment and Plan    Nausea, vomiting, abdominal pain, and diarrhea of unclear etiology Intermittent symptoms with unclear etiology.  Recent hospitalization showed elevated lipase, WBC, and hypokalemia. Symptoms improved with IV fluids, potassium, and antiemetics. Semaglutide may exacerbate symptoms.  - Ordered alpha-gal panel. - Provided Nurtec sample for potential abdominal migraine. - Hold semaglutide until symptoms resolve. - Referred to GI specialist  Hypokalemia Potassium was low during hospitalization and repleted with IV fluids. - Rechecked potassium level.  Cervicalgia - Prescribed muscle relaxers.       Return if symptoms worsen or fail to improve.   The patient indicates understanding of these issues and agrees with the plan.   Erica CHRISTELLA Search, FNP

## 2024-06-22 DIAGNOSIS — R10814 Left lower quadrant abdominal tenderness: Secondary | ICD-10-CM | POA: Diagnosis not present

## 2024-06-22 DIAGNOSIS — R1084 Generalized abdominal pain: Secondary | ICD-10-CM | POA: Diagnosis not present

## 2024-06-22 DIAGNOSIS — Z885 Allergy status to narcotic agent status: Secondary | ICD-10-CM | POA: Diagnosis not present

## 2024-06-22 DIAGNOSIS — Z90710 Acquired absence of both cervix and uterus: Secondary | ICD-10-CM | POA: Diagnosis not present

## 2024-06-22 DIAGNOSIS — R112 Nausea with vomiting, unspecified: Secondary | ICD-10-CM | POA: Diagnosis not present

## 2024-06-23 ENCOUNTER — Ambulatory Visit: Payer: Self-pay | Admitting: Family Medicine

## 2024-06-23 LAB — CMP14+EGFR
ALT: 15 IU/L (ref 0–32)
AST: 15 IU/L (ref 0–40)
Albumin: 4.5 g/dL (ref 3.8–4.9)
Alkaline Phosphatase: 47 IU/L — ABNORMAL LOW (ref 49–135)
BUN/Creatinine Ratio: 19 (ref 9–23)
BUN: 13 mg/dL (ref 6–24)
Bilirubin Total: 0.2 mg/dL (ref 0.0–1.2)
CO2: 22 mmol/L (ref 20–29)
Calcium: 9.2 mg/dL (ref 8.7–10.2)
Chloride: 100 mmol/L (ref 96–106)
Creatinine, Ser: 0.69 mg/dL (ref 0.57–1.00)
Globulin, Total: 2.5 g/dL (ref 1.5–4.5)
Glucose: 88 mg/dL (ref 70–99)
Potassium: 4.4 mmol/L (ref 3.5–5.2)
Sodium: 137 mmol/L (ref 134–144)
Total Protein: 7 g/dL (ref 6.0–8.5)
eGFR: 105 mL/min/1.73 (ref >59)

## 2024-06-23 LAB — CBC WITH DIFFERENTIAL/PLATELET
Basophils Absolute: 0.1 x10E3/uL (ref 0.0–0.2)
Basos: 1 %
EOS (ABSOLUTE): 0.2 x10E3/uL (ref 0.0–0.4)
Eos: 2 %
Hematocrit: 42.6 % (ref 34.0–46.6)
Hemoglobin: 13.7 g/dL (ref 11.1–15.9)
Immature Grans (Abs): 0 x10E3/uL (ref 0.0–0.1)
Immature Granulocytes: 0 %
Lymphocytes Absolute: 2.9 x10E3/uL (ref 0.7–3.1)
Lymphs: 33 %
MCH: 29.3 pg (ref 26.6–33.0)
MCHC: 32.2 g/dL (ref 31.5–35.7)
MCV: 91 fL (ref 79–97)
Monocytes Absolute: 0.7 x10E3/uL (ref 0.1–0.9)
Monocytes: 7 %
Neutrophils Absolute: 5.1 x10E3/uL (ref 1.4–7.0)
Neutrophils: 57 %
Platelets: 496 x10E3/uL — ABNORMAL HIGH (ref 150–450)
RBC: 4.68 x10E6/uL (ref 3.77–5.28)
RDW: 12.3 % (ref 11.7–15.4)
WBC: 8.9 x10E3/uL (ref 3.4–10.8)

## 2024-06-23 LAB — ALPHA-GAL PANEL
Allergen Lamb IgE: <0.1 kU/L
Beef IgE: <0.1 kU/L
IgE (Immunoglobulin E), Serum: 28 [IU]/mL (ref 6–495)
O215-IgE Alpha-Gal: <0.1 kU/L
Pork IgE: <0.1 kU/L

## 2024-06-23 LAB — LIPASE: Lipase: 478 U/L — ABNORMAL HIGH (ref 14–72)

## 2024-06-24 ENCOUNTER — Telehealth: Payer: Self-pay | Admitting: Family Medicine

## 2024-06-24 NOTE — Telephone Encounter (Signed)
 LM for Patient in regards to Referral.  Referral sent to [facility] as Patient requested.  Unable to send MyChart Message as Patient is not active.

## 2024-06-24 NOTE — Telephone Encounter (Unsigned)
 Copied from CRM #[id number]. Topic: Referral - Question >> Jun 23, 2024  4:21 PM Miquel SAILOR wrote: Reason for CRM: Ambulatory referral to Gastroenterology (Order # [id number]) on 06/20/2024-Needs call back when updated [phone]  PT requesting for this to be with   [address], [address] [place], [place]  [address] >> Jun 23, 2024  4:27 PM Miquel SAILOR wrote: Reason for CRM: Ambulatory referral to Gastroenterology (Order # [id number]) on 06/20/2024-Needs call back when updated [phone]  PT requesting for this to be with   [facility], [email or web address]  [address], [address] [place], [place]  [address]

## 2024-07-02 DIAGNOSIS — R112 Nausea with vomiting, unspecified: Secondary | ICD-10-CM | POA: Diagnosis not present

## 2024-07-02 DIAGNOSIS — Z8379 Family history of other diseases of the digestive system: Secondary | ICD-10-CM | POA: Diagnosis not present

## 2024-07-02 DIAGNOSIS — R1013 Epigastric pain: Secondary | ICD-10-CM | POA: Diagnosis not present

## 2024-07-02 DIAGNOSIS — R197 Diarrhea, unspecified: Secondary | ICD-10-CM | POA: Diagnosis not present

## 2024-07-09 DIAGNOSIS — R112 Nausea with vomiting, unspecified: Secondary | ICD-10-CM | POA: Diagnosis not present

## 2024-07-09 DIAGNOSIS — Z1211 Encounter for screening for malignant neoplasm of colon: Secondary | ICD-10-CM | POA: Diagnosis not present

## 2024-07-09 DIAGNOSIS — R1013 Epigastric pain: Secondary | ICD-10-CM | POA: Diagnosis not present

## 2024-07-09 DIAGNOSIS — D122 Benign neoplasm of ascending colon: Secondary | ICD-10-CM | POA: Diagnosis not present

## 2024-07-09 DIAGNOSIS — K591 Functional diarrhea: Secondary | ICD-10-CM | POA: Diagnosis not present

## 2024-07-09 DIAGNOSIS — K2100 Gastro-esophageal reflux disease with esophagitis, without bleeding: Secondary | ICD-10-CM | POA: Diagnosis not present

## 2024-07-09 DIAGNOSIS — K209 Esophagitis, unspecified without bleeding: Secondary | ICD-10-CM | POA: Diagnosis not present

## 2024-07-09 DIAGNOSIS — K635 Polyp of colon: Secondary | ICD-10-CM | POA: Diagnosis not present

## 2024-07-09 DIAGNOSIS — K3189 Other diseases of stomach and duodenum: Secondary | ICD-10-CM | POA: Diagnosis not present

## 2025-04-09 ENCOUNTER — Encounter: Payer: Self-pay | Admitting: Family Medicine
